# Patient Record
Sex: Female | Born: 1980 | Race: Black or African American | Hispanic: No | Marital: Single | Smoking: Former smoker
Health system: Southern US, Community
[De-identification: ages and names within clinical notes are randomized; demographics above are authoritative.]

## PROBLEM LIST (undated history)

## (undated) DIAGNOSIS — B977 Papillomavirus as the cause of diseases classified elsewhere: Secondary | ICD-10-CM

## (undated) DIAGNOSIS — R87629 Unspecified abnormal cytological findings in specimens from vagina: Secondary | ICD-10-CM

## (undated) HISTORY — PX: LEEP: SHX91

## (undated) HISTORY — DX: Papillomavirus as the cause of diseases classified elsewhere: B97.7

## (undated) HISTORY — DX: Unspecified abnormal cytological findings in specimens from vagina: R87.629

## (undated) HISTORY — PX: GANGLION CYST EXCISION: SHX1691

---

## 2010-10-28 ENCOUNTER — Emergency Department (HOSPITAL_BASED_OUTPATIENT_CLINIC_OR_DEPARTMENT_OTHER)
Admission: EM | Admit: 2010-10-28 | Discharge: 2010-10-28 | Disposition: A | Discharge status: Home / Self Care | Payer: Medicaid Other | Attending: Emergency Medicine | Admitting: Emergency Medicine

## 2010-10-28 ENCOUNTER — Emergency Department (INDEPENDENT_AMBULATORY_CARE_PROVIDER_SITE_OTHER): Payer: Medicaid Other

## 2010-10-28 DIAGNOSIS — R071 Chest pain on breathing: Secondary | ICD-10-CM | POA: Insufficient documentation

## 2010-10-28 DIAGNOSIS — R079 Chest pain, unspecified: Secondary | ICD-10-CM | POA: Insufficient documentation

## 2010-10-28 DIAGNOSIS — R0789 Other chest pain: Secondary | ICD-10-CM

## 2010-10-28 DIAGNOSIS — F17200 Nicotine dependence, unspecified, uncomplicated: Secondary | ICD-10-CM | POA: Insufficient documentation

## 2010-10-28 LAB — BASIC METABOLIC PANEL
BUN: 10 mg/dL (ref 6–23)
Chloride: 104 mEq/L (ref 96–112)
Glucose, Bld: 95 mg/dL (ref 70–99)
Potassium: 4.2 mEq/L (ref 3.5–5.1)

## 2011-04-04 ENCOUNTER — Emergency Department (HOSPITAL_BASED_OUTPATIENT_CLINIC_OR_DEPARTMENT_OTHER)
Admission: EM | Admit: 2011-04-04 | Discharge: 2011-04-04 | Disposition: A | Discharge status: Home / Self Care | Payer: Medicaid Other | Attending: Emergency Medicine | Admitting: Emergency Medicine

## 2011-04-04 ENCOUNTER — Encounter: Payer: Self-pay | Admitting: *Deleted

## 2011-04-04 DIAGNOSIS — R202 Paresthesia of skin: Secondary | ICD-10-CM

## 2011-04-04 DIAGNOSIS — R209 Unspecified disturbances of skin sensation: Secondary | ICD-10-CM | POA: Insufficient documentation

## 2011-04-04 NOTE — ED Notes (Signed)
Patient c/o numbness in L thumb since last night, no pain, no injury

## 2011-04-04 NOTE — ED Provider Notes (Signed)
History     CSN: [id number] Arrival date & time: 04/04/2011  4:01 PM  Chief Complaint  Patient presents with  . Numbness   HPI Comments: Pt denies any injury or weakness to the area:pt states that it is the whole forearm:pt states that she feel my cold hand touching her it just has a weird sensation  Patient is a 30 y.o. female presenting with neurologic complaint. The history is provided by the patient. No language interpreter was used.  Neurologic Problem The primary symptoms include paresthesias. Primary symptoms do not include focal weakness. The symptoms began yesterday. The symptoms are unchanged. The neurological symptoms are focal.  Additional symptoms do not include neck stiffness, weakness, pain, lower back pain, photophobia or nystagmus. Medical issues do not include hypertension or recent surgery.    Past Medical History  Diagnosis Date  . Migraine     Past Surgical History  Procedure Date  . Leep   . Ganglion cyst excision     History reviewed. No pertinent family history.  History  Substance Use Topics  . Smoking status: Former Games developer  . Smokeless tobacco: Not on file  . Alcohol Use: No    OB History    Grav Para Term Preterm Abortions TAB SAB Ect Mult Living                  Review of Systems  HENT: Negative for neck stiffness.   Eyes: Negative for photophobia.  Neurological: Positive for paresthesias. Negative for focal weakness and weakness.  All other systems reviewed and are negative.    Physical Exam  BP 132/82  Pulse 67  Temp(Src) 98.3 F (36.8 C) (Oral)  Resp 18  SpO2 100%  Physical Exam  Nursing note and vitals reviewed. Constitutional: She is oriented to person, place, and time. She appears well-developed and well-nourished.  HENT:  Head: Normocephalic.  Eyes: Pupils are equal, round, and reactive to light.  Neck: Normal range of motion. Neck supple.  Cardiovascular: Normal rate and regular rhythm.   Pulmonary/Chest: Effort  normal and breath sounds normal.  Musculoskeletal: Normal range of motion. She exhibits no edema and no tenderness.  Neurological: She is alert and oriented to person, place, and time. She has normal strength. No sensory deficit. Coordination normal.       Pt has cold sensation  Skin: Skin is warm and dry.  Psychiatric: She has a normal mood and affect.    ED Course  Procedures  MDM Will have nursing staff put splint incase related to carpal tunnel:not concerning for a cervical radiculopathy:pt has not sign of infection:will refer to hand and neurology      Teressa Lower, NP 04/04/11 [phone]

## 2011-04-05 NOTE — ED Provider Notes (Signed)
Medical screening examination/treatment/procedure(s) were performed by non-physician practitioner and as supervising physician I was immediately available for consultation/collaboration.   Cheo Selvey R. Britnay Magnussen, MD 04/05/11 0011 

## 2011-09-22 ENCOUNTER — Other Ambulatory Visit: Payer: Self-pay

## 2011-09-22 ENCOUNTER — Encounter (HOSPITAL_BASED_OUTPATIENT_CLINIC_OR_DEPARTMENT_OTHER): Payer: Self-pay | Admitting: *Deleted

## 2011-09-22 ENCOUNTER — Emergency Department (HOSPITAL_BASED_OUTPATIENT_CLINIC_OR_DEPARTMENT_OTHER)
Admission: EM | Admit: 2011-09-22 | Discharge: 2011-09-22 | Disposition: A | Discharge status: Home / Self Care | Payer: Self-pay | Attending: Emergency Medicine | Admitting: Emergency Medicine

## 2011-09-22 DIAGNOSIS — F17200 Nicotine dependence, unspecified, uncomplicated: Secondary | ICD-10-CM | POA: Insufficient documentation

## 2011-09-22 DIAGNOSIS — R002 Palpitations: Secondary | ICD-10-CM | POA: Insufficient documentation

## 2011-09-22 LAB — BASIC METABOLIC PANEL
CO2: 28 mEq/L (ref 19–32)
Calcium: 9.1 mg/dL (ref 8.4–10.5)
Creatinine, Ser: 0.8 mg/dL (ref 0.50–1.10)
Glucose, Bld: 81 mg/dL (ref 70–99)

## 2011-09-22 NOTE — ED Provider Notes (Signed)
History     CSN: [id number]  Arrival date & time 09/22/11  1339   First MD Initiated Contact with Patient 09/22/11 1346      Chief Complaint  Patient presents with  . Palpitations    HPI Patient complains of rapid heartbeat with palpitations prior to arrival associated with some dizziness.  She describes the sensation as a very rapid heartbeat.  She's never had this occur before.  She denies any specific pain or discomfort with it.  She did experience some dizziness.  Patient admits to drinking large amounts of tea every day, no other caffeine intake noted. Past Medical History  Diagnosis Date  . Migraine     Past Surgical History  Procedure Date  . Leep   . Ganglion cyst excision     History reviewed. No pertinent family history.  History  Substance Use Topics  . Smoking status: Current Everyday Smoker -- 1 years    Types: Cigars  . Smokeless tobacco: Not on file  . Alcohol Use: No    OB History    Grav Para Term Preterm Abortions TAB SAB Ect Mult Living                  Review of Systems Remaining review of systems negative except as noted in history of present illness Allergies  Erythromycin  Home Medications   Current Outpatient Rx  Name Route Sig Dispense Refill  . CULTURELLE DIGESTIVE HEALTH PO CAPS Oral Take 2 capsules by mouth as needed. yeast     . NADOLOL 40 MG PO TABS Oral Take 40 mg by mouth daily.      . SUMATRIPTAN SUCCINATE 100 MG PO TABS Oral Take 100 mg by mouth every 2 (two) hours as needed. Migraine       BP 128/89  Pulse 81  Temp(Src) 97.9 F (36.6 C) (Oral)  Resp 20  SpO2 100%  Physical Exam  Nursing note and vitals reviewed. Constitutional: She is oriented to person, place, and time. She appears well-developed and well-nourished. No distress.  HENT:  Head: Normocephalic and atraumatic.  Eyes: Pupils are equal, round, and reactive to light.  Neck: Normal range of motion.  Cardiovascular: Normal rate, regular rhythm, normal  heart sounds and intact distal pulses.          Date: 09/22/2011  Rate: 78  Rhythm: sinus arrhythmia  QRS Axis: normal  Intervals: normal  ST/T Wave abnormalities: normal  Conduction Disutrbances:none:   Old EKG Reviewed: unchanged     Pulmonary/Chest: No respiratory distress.  Abdominal: Normal appearance. She exhibits no distension.  Musculoskeletal: Normal range of motion.  Neurological: She is alert and oriented to person, place, and time. No cranial nerve deficit.  Skin: Skin is warm and dry. No rash noted.  Psychiatric: She has a normal mood and affect. Her behavior is normal.    ED Course  Procedures (including critical care time)   Labs Reviewed  BASIC METABOLIC PANEL  TROPONIN I   No results found.   1. Rapid palpitations       MDM          Nelia Shi, MD 09/22/11 1445

## 2011-09-22 NOTE — ED Notes (Signed)
Pt c/o palpitations with dizziness x 10 mins.

## 2011-09-22 NOTE — ED Notes (Signed)
MD at bedside. 

## 2016-08-30 NOTE — L&D Delivery Note (Signed)
Patient is a 36 y.o. now [id number] s/p NSVD at [redacted]w[redacted]d, who was admitted for IOL for GHTN. S/p IOL with misoprostol, foley, Oxytocin and AROM (at 13:03 today). GBS status: negative  Delivery Note At 5:40 PM a viable female was delivered via  (Presentation: vertex; LOA ).   Head delivered LOA. Nuchal cord x 1 present, loose and body delivered through. Shoulder and body delivered in usual fashion. Infant with good tone, placed on mother's abdomen, dried and bulb suctioned. Cord clamped x 2 after 1-minute delay, and cut by family member. Cord blood drawn. Placenta delivered spontaneously with gentle cord traction. Fundus firm with massage and Pitocin, but LUS atony noted with some continued bleeding. Misoprostol 1,000 mcg given rectally. Bleeding stopped with bimanual massage. Perineum inspected and found to have no lacerations.  APGAR: , ; weight pending. Placenta status: intact; to L&D.  Cord:  3-vessel  Anesthesia: Epidural Episiotomy:  none Lacerations:  none Est. Blood Loss (mL):  250  Mom to postpartum.  Baby to Couplet care / Skin to Skin.  Almyra Free P. Nasiya Pascual, MD OB Fellow 07/21/17, 6:23 PM   ---------------------------------------------------- Message sent to [facility]  [facility] admin pool: Please schedule this patient for PP visit in: 1 week BP check;  4-6 weeks for regular PPV High risk pregnancy complicated by: GHTN Delivery mode:  SVD Anticipated Birth Control:  other/unsure PP Procedures needed: BP check  Schedule Integrated [place] visit: no Provider: Any provider

## 2017-01-17 ENCOUNTER — Other Ambulatory Visit (HOSPITAL_COMMUNITY)
Admission: RE | Admit: 2017-01-17 | Discharge: 2017-01-17 | Disposition: A | Discharge status: Home / Self Care | Payer: Medicaid Other | Source: Ambulatory Visit | Attending: Obstetrics & Gynecology | Admitting: Obstetrics & Gynecology

## 2017-01-17 ENCOUNTER — Encounter: Payer: Self-pay | Admitting: Obstetrics & Gynecology

## 2017-01-17 ENCOUNTER — Ambulatory Visit (INDEPENDENT_AMBULATORY_CARE_PROVIDER_SITE_OTHER): Payer: Medicaid Other | Admitting: Obstetrics & Gynecology

## 2017-01-17 VITALS — BP 120/79 | HR 94 | Ht 64.0 in | Wt 158.0 lb

## 2017-01-17 DIAGNOSIS — Z3481 Encounter for supervision of other normal pregnancy, first trimester: Secondary | ICD-10-CM | POA: Insufficient documentation

## 2017-01-17 DIAGNOSIS — Z9889 Other specified postprocedural states: Secondary | ICD-10-CM

## 2017-01-17 DIAGNOSIS — O219 Vomiting of pregnancy, unspecified: Secondary | ICD-10-CM

## 2017-01-17 DIAGNOSIS — O99331 Smoking (tobacco) complicating pregnancy, first trimester: Secondary | ICD-10-CM

## 2017-01-17 DIAGNOSIS — R87619 Unspecified abnormal cytological findings in specimens from cervix uteri: Secondary | ICD-10-CM

## 2017-01-17 DIAGNOSIS — O99330 Smoking (tobacco) complicating pregnancy, unspecified trimester: Secondary | ICD-10-CM | POA: Insufficient documentation

## 2017-01-17 DIAGNOSIS — O09529 Supervision of elderly multigravida, unspecified trimester: Secondary | ICD-10-CM | POA: Insufficient documentation

## 2017-01-17 DIAGNOSIS — O3440 Maternal care for other abnormalities of cervix, unspecified trimester: Secondary | ICD-10-CM | POA: Insufficient documentation

## 2017-01-17 DIAGNOSIS — O09521 Supervision of elderly multigravida, first trimester: Secondary | ICD-10-CM

## 2017-01-17 DIAGNOSIS — Z3A10 10 weeks gestation of pregnancy: Secondary | ICD-10-CM | POA: Diagnosis not present

## 2017-01-17 DIAGNOSIS — Z3480 Encounter for supervision of other normal pregnancy, unspecified trimester: Secondary | ICD-10-CM

## 2017-01-17 MED ORDER — ONDANSETRON 4 MG PO TBDP: 4.0000 mg | ORAL_TABLET | Freq: Four times a day (QID) | ORAL | 0 refills | Status: DC | PRN

## 2017-01-17 NOTE — Progress Notes (Signed)
CLINICAL DATA:  Pregnant patient in 1st  trimester pregnancy with complaints of nausea and vomiting  EXAM: Transabdominal OB ULTRASOUND   TECHNIQUE:  abdominal ultrasound was performed for complete evaluation of the gestation as well as the maternal uterus, adnexal regions, and pelvic cul-de-sac.   FINDINGS: Intrauterine gestational GXQ:JJHERDE  Embryo:  present Cardiac Activity: present  Heart Rate: 166 bpm.  CRL: 4.0 cm [place] EDC:08-09-17 Subchorionic hemorrhage:  not noted  Kathrene Alu RN BSN

## 2017-01-17 NOTE — Patient Instructions (Signed)
First Trimester of Pregnancy The first trimester of pregnancy is from week 1 until the end of week 13 (months 1 through 3). A week after a sperm fertilizes an egg, the egg will implant on the wall of the uterus. This embryo will begin to develop into a baby. Genes from you and your partner will form the baby. The female genes will determine whether the baby will be a boy or a girl. At 6-8 weeks, the eyes and face will be formed, and the heartbeat can be seen on ultrasound. At the end of 12 weeks, all the baby's organs will be formed. Now that you are pregnant, you will want to do everything you can to have a healthy baby. Two of the most important things are to get good prenatal care and to follow your health care provider's instructions. Prenatal care is all the medical care you receive before the baby's birth. This care will help prevent, find, and treat any problems during the pregnancy and childbirth. Body changes during your first trimester Your body goes through many changes during pregnancy. The changes vary from woman to woman.  You may gain or lose a couple of pounds at first.  You may feel sick to your stomach (nauseous) and you may throw up (vomit). If the vomiting is uncontrollable, call your health care provider.  You may tire easily.  You may develop headaches that can be relieved by medicines. All medicines should be approved by your health care provider.  You may urinate more often. Painful urination may mean you have a bladder infection.  You may develop heartburn as a result of your pregnancy.  You may develop constipation because certain hormones are causing the muscles that push stool through your intestines to slow down.  You may develop hemorrhoids or swollen veins (varicose veins).  Your breasts may begin to grow larger and become tender. Your nipples may stick out more, and the tissue that surrounds them (areola) may become darker.  Your gums may bleed and may be  sensitive to brushing and flossing.  Dark spots or blotches (chloasma, mask of pregnancy) may develop on your face. This will likely fade after the baby is born.  Your menstrual periods will stop.  You may have a loss of appetite.  You may develop cravings for certain kinds of food.  You may have changes in your emotions from day to day, such as being excited to be pregnant or being concerned that something may go wrong with the pregnancy and baby.  You may have more vivid and strange dreams.  You may have changes in your hair. These can include thickening of your hair, rapid growth, and changes in texture. Some women also have hair loss during or after pregnancy, or hair that feels dry or thin. Your hair will most likely return to normal after your baby is born.  What to expect at prenatal visits During a routine prenatal visit:  You will be weighed to make sure you and the baby are growing normally.  Your blood pressure will be taken.  Your abdomen will be measured to track your baby's growth.  The fetal heartbeat will be listened to between weeks 10 and 14 of your pregnancy.  Test results from any previous visits will be discussed.  Your health care provider may ask you:  How you are feeling.  If you are feeling the baby move.  If you have had any abnormal symptoms, such as leaking fluid, bleeding, severe headaches,   or abdominal cramping.  If you are using any tobacco products, including cigarettes, chewing tobacco, and electronic cigarettes.  If you have any questions.  Other tests that may be performed during your first trimester include:  Blood tests to find your blood type and to check for the presence of any previous infections. The tests will also be used to check for low iron levels (anemia) and protein on red blood cells (Rh antibodies). Depending on your risk factors, or if you previously had diabetes during pregnancy, you may have tests to check for high blood  sugar that affects pregnant women (gestational diabetes).  Urine tests to check for infections, diabetes, or protein in the urine.  An ultrasound to confirm the proper growth and development of the baby.  Fetal screens for spinal cord problems (spina bifida) and Down syndrome.  HIV (human immunodeficiency virus) testing. Routine prenatal testing includes screening for HIV, unless you choose not to have this test.  You may need other tests to make sure you and the baby are doing well.  Follow these instructions at home: Medicines  Follow your health care provider's instructions regarding medicine use. Specific medicines may be either safe or unsafe to take during pregnancy.  Take a prenatal vitamin that contains at least 600 micrograms (mcg) of folic acid.  If you develop constipation, try taking a stool softener if your health care provider approves. Eating and drinking  Eat a balanced diet that includes fresh fruits and vegetables, whole grains, good sources of protein such as meat, eggs, or tofu, and low-fat dairy. Your health care provider will help you determine the amount of weight gain that is right for you.  Avoid raw meat and uncooked cheese. These carry germs that can cause birth defects in the baby.  Eating four or five small meals rather than three large meals a day may help relieve nausea and vomiting. If you start to feel nauseous, eating a few soda crackers can be helpful. Drinking liquids between meals, instead of during meals, also seems to help ease nausea and vomiting.  Limit foods that are high in fat and processed sugars, such as fried and sweet foods.  To prevent constipation: ? Eat foods that are high in fiber, such as fresh fruits and vegetables, whole grains, and beans. ? Drink enough fluid to keep your urine clear or pale yellow. Activity  Exercise only as directed by your health care provider. Most women can continue their usual exercise routine during  pregnancy. Try to exercise for 30 minutes at least 5 days a week. Exercising will help you: ? Control your weight. ? Stay in shape. ? Be prepared for labor and delivery.  Experiencing pain or cramping in the lower abdomen or lower back is a good sign that you should stop exercising. Check with your health care provider before continuing with normal exercises.  Try to avoid standing for long periods of time. Move your legs often if you must stand in one place for a long time.  Avoid heavy lifting.  Wear low-heeled shoes and practice good posture.  You may continue to have sex unless your health care provider tells you not to. Relieving pain and discomfort  Wear a good support bra to relieve breast tenderness.  Take warm sitz baths to soothe any pain or discomfort caused by hemorrhoids. Use hemorrhoid cream if your health care provider approves.  Rest with your legs elevated if you have leg cramps or low back pain.  If you develop   varicose veins in your legs, wear support hose. Elevate your feet for 15 minutes, 3-4 times a day. Limit salt in your diet. Prenatal care  Schedule your prenatal visits by the twelfth week of pregnancy. They are usually scheduled monthly at first, then more often in the last 2 months before delivery.  Write down your questions. Take them to your prenatal visits.  Keep all your prenatal visits as told by your health care provider. This is important. Safety  Wear your seat belt at all times when driving.  Make a list of emergency phone numbers, including numbers for family, friends, the hospital, and police and fire departments. General instructions  Ask your health care provider for a referral to a local prenatal education class. Begin classes no later than the beginning of month 6 of your pregnancy.  Ask for help if you have counseling or nutritional needs during pregnancy. Your health care provider can offer advice or refer you to specialists for help  with various needs.  Do not use hot tubs, steam rooms, or saunas.  Do not douche or use tampons or scented sanitary pads.  Do not cross your legs for long periods of time.  Avoid cat litter boxes and soil used by cats. These carry germs that can cause birth defects in the baby and possibly loss of the fetus by miscarriage or stillbirth.  Avoid all smoking, herbs, alcohol, and medicines not prescribed by your health care provider. Chemicals in these products affect the formation and growth of the baby.  Do not use any products that contain nicotine or tobacco, such as cigarettes and e-cigarettes. If you need help quitting, ask your health care provider. You may receive counseling support and other resources to help you quit.  Schedule a dentist appointment. At home, brush your teeth with a soft toothbrush and be gentle when you floss. Contact a health care provider if:  You have dizziness.  You have mild pelvic cramps, pelvic pressure, or nagging pain in the abdominal area.  You have persistent nausea, vomiting, or diarrhea.  You have a bad smelling vaginal discharge.  You have pain when you urinate.  You notice increased swelling in your face, hands, legs, or ankles.  You are exposed to fifth disease or chickenpox.  You are exposed to German measles (rubella) and have never had it. Get help right away if:  You have a fever.  You are leaking fluid from your vagina.  You have spotting or bleeding from your vagina.  You have severe abdominal cramping or pain.  You have rapid weight gain or loss.  You vomit blood or material that looks like coffee grounds.  You develop a severe headache.  You have shortness of breath.  You have any kind of trauma, such as from a fall or a car accident. Summary  The first trimester of pregnancy is from week 1 until the end of week 13 (months 1 through 3).  Your body goes through many changes during pregnancy. The changes vary from  woman to woman.  You will have routine prenatal visits. During those visits, your health care provider will examine you, discuss any test results you may have, and talk with you about how you are feeling. This information is not intended to replace advice given to you by your health care provider. Make sure you discuss any questions you have with your health care provider. Document Released: 08/10/2001 Document Revised: 07/28/2016 Document Reviewed: 07/28/2016 Elsevier Interactive Patient Education  2017 Elsevier   Inc.  

## 2017-01-17 NOTE — Addendum Note (Signed)
Addended by: Phill Myron on: 01/17/2017 11:30 AM   Modules accepted: Orders

## 2017-01-17 NOTE — Progress Notes (Signed)
  Subjective:    Courtney Farley is being seen today for her first obstetrical visit. [id number]. Last pregnancy 2006 LMP 11/01/2016 (second week of Mar). This is not a planned pregnancy. Pt is 'dealing' with it. She is at [redacted]w[redacted]d gestation. Her obstetrical history is significant for tob use early. Significant N/V/ Relationship with FOB: n/a. Patient does intend to breast feed. Pregnancy history fully reviewed. Pt c/o N/V. She was seen by Dr. Nyoka Farley and prescribed meds for N/V but, she could not get the Rx due to insurance issues. Tjh esx are slightly better it is no longer constant but the sx are still bad in the am and the evening.   H/o cigar smoking since age 72 years. Pt has stopped sine pregnancy diagnosed.   Patient reports nausea and vomiting.  Review of Systems:   Review of Systems: N/V  Objective:     BP 120/79   Pulse 94   Ht 5\' 4"  (1.626 m)   Wt 158 lb (71.7 kg)   LMP 11/02/2016   BMI 27.12 kg/m  Physical Exam  Exam  General Appearance:    Alert, cooperative, no distress, appears stated age  Head:    Normocephalic, without obvious abnormality, atraumatic  Eyes:    conjunctiva/corneas clear, EOM's intact, both eyes  Ears:    Normal external ear canals, both ears  Nose:   Nares normal, septum midline, mucosa normal, no drainage    or sinus tenderness  Throat:   Lips, mucosa, and tongue normal; teeth and gums normal  Neck:   Supple, symmetrical, trachea midline, no adenopathy;    thyroid:  no enlargement/tenderness/nodules  Back:     Symmetric, no curvature, ROM normal, no CVA tenderness  Lungs:     Clear to auscultation bilaterally, respirations unlabored  Chest Wall:    No tenderness or deformity   Heart:    Regular rate and rhythm, S1 and S2 normal, no murmur, rub   or gallop  Breast Exam:    No tenderness, masses, or nipple abnormality  Abdomen:     Soft, non-tender, bowel sounds active all four quadrants,    no masses, no organomegaly; pt has umbilical ring  Genitalia:     Normal female without lesion, discharge or tenderness; cervix: 3L and closed.  Pt has a clitoral piercing      Extremities:   Extremities normal, atraumatic, no cyanosis or edema  Pulses:   2+ and symmetric all extremities  Skin:   Skin color, texture, turgor normal, no rashes or lesions       Assessment:    Pregnancy: [id number] Patient Active Problem List   Diagnosis Date Noted  . Supervision of other normal pregnancy, antepartum 01/17/2017  . Abnormal cervical Papanicolaou smear affecting pregnancy, antepartum 01/17/2017  . Advanced maternal age in multigravida 01/17/2017  s/p LEEP x 2- both LEEPs were after her last delivery.  N/V in pegnancy     Plan:     Initial labs drawn. Prenatal vitamins. Problem list reviewed and updated. AFP3 discussed: requested. Pt desires 1st trimester screen Role of ultrasound in pregnancy discussed; fetal survey: requested. Amniocentesis discussed: not indicated. Follow up in 4 weeks. 65% of 45 min visit spent on counseling and coordination of care.  serial cervical lengths Due to h/o LEEP Zofran 4mg  q 4 hours  for N/V Colposcopy   Courtney Farley 01/17/2017

## 2017-01-18 LAB — OBSTETRIC PANEL, INCLUDING HIV
ANTIBODY SCREEN: NEGATIVE
BASOS: 0 %
Basophils Absolute: 0 10*3/uL (ref 0.0–0.2)
EOS (ABSOLUTE): 0.1 10*3/uL (ref 0.0–0.4)
Eos: 1 %
HEMATOCRIT: 37.5 % (ref 34.0–46.6)
HEMOGLOBIN: 12.4 g/dL (ref 11.1–15.9)
HEP B S AG: NEGATIVE
HIV SCREEN 4TH GENERATION: NONREACTIVE
IMMATURE GRANS (ABS): 0 10*3/uL (ref 0.0–0.1)
Immature Granulocytes: 0 %
Lymphocytes Absolute: 2.2 10*3/uL (ref 0.7–3.1)
Lymphs: 29 %
MCH: 28.4 pg (ref 26.6–33.0)
MCHC: 33.1 g/dL (ref 31.5–35.7)
MCV: 86 fL (ref 79–97)
MONOCYTES: 9 %
Monocytes Absolute: 0.7 10*3/uL (ref 0.1–0.9)
NEUTROS ABS: 4.8 10*3/uL (ref 1.4–7.0)
Neutrophils: 61 %
Platelets: 338 10*3/uL (ref 150–379)
RBC: 4.37 x10E6/uL (ref 3.77–5.28)
RDW: 13.8 % (ref 12.3–15.4)
RPR: NONREACTIVE
RUBELLA: 9.21 {index} (ref >0.99)
Rh Factor: POSITIVE
WBC: 7.7 10*3/uL (ref 3.4–10.8)

## 2017-01-18 LAB — GC/CHLAMYDIA PROBE AMP (~~LOC~~) NOT AT ARMC
Chlamydia: NEGATIVE
NEISSERIA GONORRHEA: NEGATIVE

## 2017-02-01 ENCOUNTER — Encounter (HOSPITAL_COMMUNITY): Payer: Self-pay | Admitting: *Deleted

## 2017-02-03 ENCOUNTER — Ambulatory Visit (HOSPITAL_COMMUNITY)
Admission: RE | Admit: 2017-02-03 | Discharge: 2017-02-03 | Disposition: A | Discharge status: Home / Self Care | Payer: Medicaid Other | Source: Ambulatory Visit | Attending: Obstetrics & Gynecology | Admitting: Obstetrics & Gynecology

## 2017-02-03 ENCOUNTER — Encounter (HOSPITAL_COMMUNITY): Payer: Self-pay

## 2017-02-03 ENCOUNTER — Other Ambulatory Visit: Payer: Self-pay | Admitting: Obstetrics & Gynecology

## 2017-02-03 DIAGNOSIS — Z3A13 13 weeks gestation of pregnancy: Secondary | ICD-10-CM | POA: Insufficient documentation

## 2017-02-03 DIAGNOSIS — N8311 Corpus luteum cyst of right ovary: Secondary | ICD-10-CM | POA: Insufficient documentation

## 2017-02-03 DIAGNOSIS — O3412 Maternal care for benign tumor of corpus uteri, second trimester: Secondary | ICD-10-CM | POA: Diagnosis not present

## 2017-02-03 DIAGNOSIS — Z3480 Encounter for supervision of other normal pregnancy, unspecified trimester: Secondary | ICD-10-CM

## 2017-02-03 DIAGNOSIS — O283 Abnormal ultrasonic finding on antenatal screening of mother: Secondary | ICD-10-CM

## 2017-02-03 DIAGNOSIS — Z3682 Encounter for antenatal screening for nuchal translucency: Secondary | ICD-10-CM | POA: Insufficient documentation

## 2017-02-03 DIAGNOSIS — D259 Leiomyoma of uterus, unspecified: Secondary | ICD-10-CM | POA: Diagnosis not present

## 2017-02-04 ENCOUNTER — Encounter (HOSPITAL_COMMUNITY): Payer: Self-pay

## 2017-02-04 DIAGNOSIS — O283 Abnormal ultrasonic finding on antenatal screening of mother: Secondary | ICD-10-CM | POA: Insufficient documentation

## 2017-02-04 DIAGNOSIS — Z3A13 13 weeks gestation of pregnancy: Secondary | ICD-10-CM | POA: Insufficient documentation

## 2017-02-04 NOTE — Progress Notes (Signed)
Genetic Counseling  High-Risk Gestation Note  Appointment Date:  02/03/17 Referred By: Willodean Rosenthal* Date of Birth:  21-Jul-1981 Farley: Courtney Farley   Pregnancy History: [id number] Estimated Date of Delivery: 08/09/17 Estimated Gestational Age: [redacted]w[redacted]d Attending: Eulis Foster, MD  I met with Courtney Farley and Courtney Farley, Courtney Farley, for genetic counseling because of increased nuchal translucency measurement on today's ultrasound.   In summary:  Discussed ultrasound findings in detail-  Increased nuchal translucency  Discussed associations including normal variation, chromosome condition, structural anomaly, single gene condition  Reviewed options for additional screening  NIPS- elected to pursue Panorama today  Echocardiogram  Ongoing ultrasound  Reviewed options for diagnostic testing, including risks, benefits, limitations and alternatives  Declined amniocentesis  Reviewed other explanations for ultrasound findings  Reviewed family history concerns  Two nieces with Congenital Adrenal Hyperplasia  Reviewed autosomal recessive inheritance an approximate 50% chance for patient to be carrier given report  Father of pregnancy would be estimated to have general population carrier frequency  Patient declined CAH carrier screening at this time  The patient had a nuchal translucency (NT) ultrasound today, which revealed an NT measurement of 3.1 mm, which is approximately the 97%tile for the current gestational age. Nasal bone was visualized today. We discussed that the fetal NT refers to a fluid filled space between the skin and soft tissues behind the cervical spine. This space is traditionally measurable between 11 and 13.[redacted] weeks gestation and is considered enlarged when the measurement is equal to or greater than the 95th percentile for the gestational age. This couple was counseled regarding the various common etiologies for an enlarged NT including: aneuploidy, single gene  conditions, cardiac or great vessel abnormalities, lymphatic system failure, decreased fetal movement, and fetal anemia.   We reviewed chromosomes, nondisjunction, and the common features and prognoses of Down syndrome and other aneuploidies. Considering Ms. Conrey's age, an NT of 3.1 mm and a CRL of 81 mm, the risk for fetal Down syndrome is estimated to be 1 in 170, with the overall risk for fetal aneuploidy estimated to be 1 in 85. They were then counseled regarding the availability of aneuploidy screening options including First screen, noninvasive prenatal screening (NIPS)/prenatal cell free DNA testing, and detailed ultrasound in the second trimester. They were counseled that screening tests are used to modify a patient's a priori risk for aneuploidy, typically based on age. This estimate provides a pregnancy specific risk assessment. We reviewed the benefits and limitations of each option. Specifically, we discussed the conditions for which each test screens, the detection rates, and false positive rates of each. They were also counseled regarding diagnostic testing via CVS and amniocentesis. We reviewed the associated risks for complications, including spontaneous pregnancy loss. We discussed the possible results that the tests might provide including: positive, negative, unanticipated, and no result. Finally, they were counseled regarding the cost of each option and potential out of pocket expenses. After consideration of all the options, they elected to proceed with NIPS (Panorama through [facility] laboratory). Those results will be available in approximately 7-10 days. She declined diagnostic, invasive testing (CVS, amniocentesis).   In addition, we discussed that an NT of 3.1 mm is associated with an approximate 1% risk for a fetal cardiac anomaly. We discussed the options of a fetal echocardiogram and detailed anatomy ultrasound as methods to evaluate the fetal heart. A fetal echocardiogram as well as  detailed ultrasound are available in the second trimester.   We also discussed single gene conditions. They were counseled  that an increased NT is associated with an increased chance for specific single gene conditions including Noonan spectrum disorders, skeletal dysplasias, SLOS, CdLS, and many others. We discussed that these conditions are not routinely tested for prenatally unless ultrasound findings or family history significantly increase the suspicion of a specific single gene disorder; however, there is new noninvasive prenatal screening (NIPS) technology which assesses for specific alterations in 30 genes, including the genes associated with Noonan syndrome, some skeletal dysplasias, and CdLS. The genes included on this NIPS platform typically follow autosomal dominant or X-linked inheritance, with the majority of pathogenic variants occurring de novo. We discussed that this testing, marketed as Senaida Ores, is available. We reviewed the benefits, limitations, and cost of this technology. Maternal and paternal samples are required for this screen. Limitations include that the testing cannot be performed if the patient herself has an underlying pathogenic variant in one of these genes. If the father of the pregnancy is identified to have a pathogenic variant, then a separate report would be issued. Detection rates vary by gene and are reported to range from  They understand that many of the features of these conditions can be detected by detailed ultrasound during the second trimester; however, ultrasound cannot definitively diagnose or rule out these conditions. NIPS for single gene conditions does not assess for autosomal recessive conditions and does not assess for all genetic conditions. We briefly reviewed common inheritance patterns (dominant, recessive, and X-linked) as well as the associated risks of recurrence. Ms. Clarida declined Senaida Ores today. Consider single gene NIPS platform, pending results of  NIPS for aneuploidy and/or pending results of detailed anatomy ultrasound.   Regarding autosomal recessive conditions, carrier screening is available to the patient and/or Courtney Farley for select conditions, some of which can be associated with increased NT, such as SLOS. We reviewed risks, benefits, and limitations of expanded carrier screening panels. We discussed that there is also a chance of identifying carrier status for a condition which is not related to the ultrasound finding. The prevalence of each condition varies (and often varies with ethnicity). Thus the couples' background risk to be a carrier for each of these various conditions would range, and in some cases be very low or unknown. Similarly, the detection rate varies with each condition and also varies in some cases with ethnicity, ranging from greater than 99% (in the case of hemoglobinopathies) to unknown. We reviewed that a negative carrier screen would thus reduce, but not eliminate the chance to be a carrier for these conditions. For some conditions included on specific pan-ethnic carrier screening panels, the pre-test carrier frequency and/or the detection rate is unknown. Thus, for some conditions, the exact reduction of risk with a negative carrier screening result may not be able to be quantified. We reviewed that in the event that one Farley is found to be a carrier for one or more conditions, carrier screening would be available to the Farley for those conditions. Ms. Perri declined expanded carrier screening today.   We also discussed that an increased NT value can be a normal variant, which can resolve during the pregnancy. Given the measured NT on ultrasound today, this is the most likely scenario. They were counseled that the fetal prognosis depends on the underlying etiology of the enlarged NT and further anticipatory guidance can be provided if a diagnosis is discovered.  They understand that ultrasound and screening tests  cannot diagnose or rule out all birth defects or genetic syndromes. The patient was advised of this  limitation.   Both family histories were reviewed and found to be contributory for Congenital Adrenal Hyperplasia (CAH) for two of Ms. Spratling's nieces (Courtney sister's daughters). The patient reported that Courtney nieces were each born with ambiguous genitalia and had surgical correction. They are treated with cortisol, are currently ages 63 years and 34 years old, and are reportedly doing well. The specific type of CAH was not known by Ms. Barbee. We discuss that the virilizing congential adrenal hyperplasias are a group of autosomal recessive disorders characterized by impaired synthesis of cortisol from cholesterol by the adrenal cortex.  21-hydroxylase deficiency (21-OHD) is the most common cause of CAH.  In 21-OHD CAH, excessive adrenal androgen biosynthesis results in virilization in all individuals and salt wasting in some individuals.  A classic form with severe enzyme deficiency and prenatal onset of virilization is distinguished from a non-classic form with mild enzyme deficiency and postnatal onset.  There is not sufficient information available to make a determination about the specific type of CAH in the family, though we discussed that 21-OHD CAH is the most common form. Mutations in the CYP21A2 gene located on chromosome 6, are responsible for 21-OHD.    We reviewed the autosomal recessive inheritance of CAH. Given the reported family history, the patient's sister would be assumed to be an obligate carrier, in which case Ms. Limones would have a 50% (1 in 2) chance to be a carrier for [facility].  There are reports of de novo gene mutations in CAH, however, such that one parent could have two normal CAH genes and still have a child with CAH due to a spontaneous mutation.  As the father of the baby is not related to Ms. Holz's family, he would have the general population chance to be a carrier for [facility] which is  approximately 1 in 42.  Thus, without further testing, there is an approximate 1 in 480 chance for CAH in a pregnancy for the couple together. We discussed that carrier screening for CAH could be performed for Ms. Gaut or Courtney Farley, but testing would be most informative after first confirming the type of CAH in the family to ensure the correct gene is tested. Additionally, carrier screening for Ms. Kuntzman would be most informative if the familial pathogenic variant has been identified. Carrier screening for CYP21A2 in the general population has an approximate 92% detection rate. Prenatal diagnosis would be available for an identified carrier couple via amniocentesis.  The patient stated that she is not interested in carrier screening for CAH at this time.   Additionally, Ms. Mccroskey reported four maternal first cousin (all full siblings to each other) with autism; these relatives are Courtney maternal aunt's two sons and two daughters. The patient reported that one girl and one boy are more severely affected than the other two. They were not described to have any dysmorphic features. No underlying etiology is known. We discussed that autism is part of the spectrum of conditions referred to as Autistic spectrum disorders (ASD). We discussed that ASDs are among the most common neurodevelopmental disorders, with approximately 1 in 68 children meeting criteria for ASD, according to the Centers for Disease Control. Approximately 80% of individuals diagnosed are female. There is strong evidence that genetic factors play a critical role in development of ASD. There have been recent advances in identifying specific genetic causes of ASD, however, there are still many individuals for whom the etiology of the ASD is not known. In the case of four siblings with  autism, an underlying genetic etiology or component would be strongly suspected. Once a family has a child with a diagnosis of ASD, there is a 13.5% chance to have another  child with ASD. If the pregnancy is female the chance is approximately 9%, and approximately 26% if the pregnancy is female. When there is more than one affected sibling, the recurrence chance is 32%. Limited data are available regarding recurrence risk for extended degree relatives. In the absence of an identified genetic etiology, prenatal screening or testing would not be available in the current pregnancy for the autism spectrum disorders in the family. Without further information regarding the provided family history, an accurate genetic risk cannot be calculated. Further genetic counseling is warranted if more information is obtained.  Ms. Gilberte Gorley denied exposure to environmental toxins or chemical agents. She denied the use of alcohol, tobacco or street drugs. She denied significant viral illnesses during the course of Courtney pregnancy. Courtney medical and surgical histories were noncontributory.   I counseled this couple regarding the above risks and available options.  The approximate face-to-face time with the genetic counselor was 40 minutes.  Chipper Oman, MS Certified Genetic Counselor 02/04/2017

## 2017-02-09 ENCOUNTER — Other Ambulatory Visit: Payer: Self-pay

## 2017-02-09 DIAGNOSIS — Z3480 Encounter for supervision of other normal pregnancy, unspecified trimester: Secondary | ICD-10-CM

## 2017-02-10 ENCOUNTER — Encounter: Payer: Medicaid Other | Admitting: Obstetrics & Gynecology

## 2017-02-10 ENCOUNTER — Telehealth (HOSPITAL_COMMUNITY): Payer: Self-pay | Admitting: MS"

## 2017-02-10 NOTE — Telephone Encounter (Signed)
Attempted to contact patient regarding results of noninvasive prenatal screening for aneuploidy (Panorama), which are within normal limits. Patient did not answer, and voice mailbox is not set up. Unable to leave message for patient to return call.   [name]  02/10/2017 9:03 AM

## 2017-02-11 ENCOUNTER — Telehealth (HOSPITAL_COMMUNITY): Payer: Self-pay | Admitting: MS"

## 2017-02-11 NOTE — Telephone Encounter (Signed)
Called Courtney Farley to discuss her prenatal cell free DNA test results.  Ms. Courtney Farley had Panorama testing through [facility].  Testing was offered because of increased nuchal translucency on ultrasound.   The patient was identified by name and DOB.  We reviewed that these are within normal limits, showing a less than 1 in 10,000 risk for trisomies 21, 18 and 13, and monosomy X (Turner syndrome).  In addition, the risk for triploidy and sex chromosome trisomies (47,XXX and 47,XXY) was also low risk.  Analysis for 22q11 deletion syndrome was also low risk (1 in 9,000).  We reviewed that this testing identifies > 99% of pregnancies with trisomy 7, trisomy 59, sex chromosome trisomies (47,XXX and 47,XXY), and triploidy. The detection rate for trisomy 18 is 98%.  The detection rate for monosomy X is ~92%.  The false positive rate is <0.1% for all conditions. Testing was also consistent with female fetal sex.  The patient did wish to know fetal sex.  She understands that this testing does not identify all genetic conditions.  All questions were answered to her satisfaction, she was encouraged to call with additional questions or concerns.  Chipper Oman, MS Certified Genetic Counselor 02/11/2017 12:43 PM

## 2017-02-11 NOTE — Telephone Encounter (Signed)
Attempted to contact patient regarding results of noninvasive prenatal screening (Panorama), which are within normal limits. Left message for patient to return call.   Santiago Glad Shahad Mazurek 02/11/2017 12:14 PM

## 2017-02-14 ENCOUNTER — Encounter: Payer: Medicaid Other | Admitting: Obstetrics & Gynecology

## 2017-02-16 ENCOUNTER — Ambulatory Visit (INDEPENDENT_AMBULATORY_CARE_PROVIDER_SITE_OTHER): Payer: Medicaid Other | Admitting: Obstetrics & Gynecology

## 2017-02-16 VITALS — BP 118/70 | HR 94 | Wt 161.0 lb

## 2017-02-16 DIAGNOSIS — Z331 Pregnant state, incidental: Secondary | ICD-10-CM

## 2017-02-16 DIAGNOSIS — R87619 Unspecified abnormal cytological findings in specimens from cervix uteri: Secondary | ICD-10-CM

## 2017-02-16 DIAGNOSIS — O283 Abnormal ultrasonic finding on antenatal screening of mother: Secondary | ICD-10-CM

## 2017-02-16 DIAGNOSIS — O99332 Smoking (tobacco) complicating pregnancy, second trimester: Secondary | ICD-10-CM

## 2017-02-16 DIAGNOSIS — O3442 Maternal care for other abnormalities of cervix, second trimester: Secondary | ICD-10-CM

## 2017-02-16 DIAGNOSIS — O3440 Maternal care for other abnormalities of cervix, unspecified trimester: Secondary | ICD-10-CM

## 2017-02-16 DIAGNOSIS — Z3480 Encounter for supervision of other normal pregnancy, unspecified trimester: Secondary | ICD-10-CM

## 2017-02-16 DIAGNOSIS — Z9889 Other specified postprocedural states: Secondary | ICD-10-CM

## 2017-02-16 DIAGNOSIS — Z3482 Encounter for supervision of other normal pregnancy, second trimester: Secondary | ICD-10-CM

## 2017-02-16 DIAGNOSIS — O99330 Smoking (tobacco) complicating pregnancy, unspecified trimester: Secondary | ICD-10-CM

## 2017-02-16 NOTE — Progress Notes (Signed)
Patient presents for colposcopy. Kathrene Alu RNBSN

## 2017-02-16 NOTE — Progress Notes (Signed)
   PRENATAL VISIT NOTE  Subjective:  Courtney Farley is a 36 y.o. G3P2002 at Unknown being seen today for ongoing prenatal care.  She is currently monitored for the following issues for this high-risk pregnancy and has Supervision of other normal pregnancy, antepartum; Abnormal cervical Papanicolaou smear affecting pregnancy, antepartum; History of cervical LEEP biopsy affecting care of mother, antepartum; Maternal tobacco use, antepartum; and Increased nuchal translucency space on fetal ultrasound on her problem list.  Patient reports no complaints.  Contractions: Not present. Vag. Bleeding: None.  Movement: Present. Denies leaking of fluid.   The following portions of the patient's history were reviewed and updated as appropriate: allergies, current medications, past family history, past medical history, past social history, past surgical history and problem list. Problem list updated.  Objective:   Vitals:   02/16/17 1443  BP: 118/70  Pulse: 94  Weight: 161 lb (73 kg)    Fetal Status:     Movement: Present     General:  Alert, oriented and cooperative. Patient is in no acute distress.  Skin: Skin is warm and dry. No rash noted.   Cardiovascular: Normal heart rate noted  Respiratory: Normal respiratory effort, no problems with respiration noted  Abdomen: Soft, gravid, appropriate for gestational age.       Pelvic:  Cervical exam performed        Extremities: Normal range of motion.  Edema: None  Mental Status: Normal mood and affect. Normal behavior. Normal judgment and thought content.   Assessment and Plan:  Pregnancy: G3P2002 at Unknown  1. Supervision of other normal pregnancy, antepartum  - [place] MFM OB COMP + 14 WK; Future  2. Maternal tobacco use, antepartum  3. Increased nuchal translucency space on fetal ultrasound AFP on next visit  [place] M[facility] OB COMP + 14 WK; Future  4. History of cervical LEEP biopsy affecting care of mother, antepartum Cervix appear shortened. Rec CL  prior to anatomy scan- in 1-2 days. Discussed with pt possible need for cerclage.   - [place] M[facility] OB Transvaginal; Future  5. Abnormal cervical Papanicolaou smear affecting pregnancy, antepartum S/p colposcopy today Patient given informed consent, signed copy in the chart, time out was performed.  Placed in lithotomy position. Cervix viewed with speculum and colposcope after application of acetic acid.   Colposcopy adequate?  yes Acetowhite lesions? no Punctation? no Mosaicism?  no Abnormal vasculature?  no Biopsies? no ECC? no   Patient was given post procedure instructions.  She will return in 2 weeks for results.  Preterm labor symptoms and general obstetric precautions including but not limited to vaginal bleeding, contractions, leaking of fluid and fetal movement were reviewed in detail with the patient. Please refer to After Visit Summary for other counseling recommendations.  Return in about 2 weeks (around 03/02/2017).   [name], MD

## 2017-02-16 NOTE — Addendum Note (Signed)
Addended by: [name] on: 02/16/2017 03:41 PM   Modules accepted: Orders

## 2017-02-16 NOTE — Patient Instructions (Signed)

## 2017-02-17 ENCOUNTER — Encounter (HOSPITAL_COMMUNITY): Payer: Self-pay

## 2017-02-17 ENCOUNTER — Ambulatory Visit (HOSPITAL_COMMUNITY)
Admission: RE | Admit: 2017-02-17 | Discharge: 2017-02-17 | Disposition: A | Discharge status: Home / Self Care | Payer: Medicaid Other | Source: Ambulatory Visit | Attending: Obstetrics & Gynecology | Admitting: Obstetrics & Gynecology

## 2017-02-17 DIAGNOSIS — R87619 Unspecified abnormal cytological findings in specimens from cervix uteri: Secondary | ICD-10-CM | POA: Insufficient documentation

## 2017-02-17 DIAGNOSIS — O3440 Maternal care for other abnormalities of cervix, unspecified trimester: Secondary | ICD-10-CM | POA: Insufficient documentation

## 2017-02-17 DIAGNOSIS — O321XX0 Maternal care for breech presentation, not applicable or unspecified: Secondary | ICD-10-CM | POA: Insufficient documentation

## 2017-02-17 DIAGNOSIS — O283 Abnormal ultrasonic finding on antenatal screening of mother: Secondary | ICD-10-CM

## 2017-02-17 DIAGNOSIS — Z3686 Encounter for antenatal screening for cervical length: Secondary | ICD-10-CM | POA: Insufficient documentation

## 2017-02-17 DIAGNOSIS — Z9889 Other specified postprocedural states: Secondary | ICD-10-CM | POA: Insufficient documentation

## 2017-02-17 DIAGNOSIS — Z3A15 15 weeks gestation of pregnancy: Secondary | ICD-10-CM | POA: Diagnosis not present

## 2017-02-17 NOTE — Addendum Note (Signed)
Encounter addended by: Jill Poling, RT on: 02/17/2017  4:25 PM<BR>    Actions taken: Imaging Exam ended

## 2017-02-18 NOTE — Addendum Note (Signed)
Encounter addended by: [name], MD on: 02/18/2017 12:27 AM<BR>    Actions taken: Problem List modified

## 2017-03-03 ENCOUNTER — Other Ambulatory Visit (HOSPITAL_COMMUNITY)
Admission: RE | Admit: 2017-03-03 | Discharge: 2017-03-03 | Disposition: A | Discharge status: Home / Self Care | Payer: Medicaid Other | Source: Ambulatory Visit | Attending: Obstetrics & Gynecology | Admitting: Obstetrics & Gynecology

## 2017-03-03 ENCOUNTER — Ambulatory Visit (INDEPENDENT_AMBULATORY_CARE_PROVIDER_SITE_OTHER): Payer: Medicaid Other | Admitting: Obstetrics & Gynecology

## 2017-03-03 VITALS — BP 124/85 | HR 92 | Wt 165.0 lb

## 2017-03-03 DIAGNOSIS — O3440 Maternal care for other abnormalities of cervix, unspecified trimester: Secondary | ICD-10-CM

## 2017-03-03 DIAGNOSIS — Z3482 Encounter for supervision of other normal pregnancy, second trimester: Secondary | ICD-10-CM

## 2017-03-03 DIAGNOSIS — Z3480 Encounter for supervision of other normal pregnancy, unspecified trimester: Secondary | ICD-10-CM

## 2017-03-03 DIAGNOSIS — B9689 Other specified bacterial agents as the cause of diseases classified elsewhere: Secondary | ICD-10-CM | POA: Insufficient documentation

## 2017-03-03 DIAGNOSIS — O283 Abnormal ultrasonic finding on antenatal screening of mother: Secondary | ICD-10-CM

## 2017-03-03 DIAGNOSIS — O99330 Smoking (tobacco) complicating pregnancy, unspecified trimester: Secondary | ICD-10-CM

## 2017-03-03 DIAGNOSIS — N898 Other specified noninflammatory disorders of vagina: Secondary | ICD-10-CM

## 2017-03-03 DIAGNOSIS — Z9889 Other specified postprocedural states: Secondary | ICD-10-CM

## 2017-03-03 DIAGNOSIS — N760 Acute vaginitis: Secondary | ICD-10-CM | POA: Insufficient documentation

## 2017-03-03 DIAGNOSIS — O99332 Smoking (tobacco) complicating pregnancy, second trimester: Secondary | ICD-10-CM

## 2017-03-03 DIAGNOSIS — R87619 Unspecified abnormal cytological findings in specimens from cervix uteri: Secondary | ICD-10-CM

## 2017-03-03 DIAGNOSIS — O3442 Maternal care for other abnormalities of cervix, second trimester: Secondary | ICD-10-CM

## 2017-03-03 NOTE — Progress Notes (Signed)
   PRENATAL VISIT NOTE  Subjective:  Courtney Farley is a 36 y.o. G3P2002 at 88w2dbeing seen today for ongoing prenatal care.  She is currently monitored for the following issues for this high-risk pregnancy and has Supervision of other normal pregnancy, antepartum; Abnormal cervical Papanicolaou smear affecting pregnancy, antepartum; History of cervical LEEP biopsy affecting care of mother, antepartum; Maternal tobacco use, antepartum; and Increased nuchal translucency space on fetal ultrasound on her problem list.  Patient reports she has a discharge with a musty odor. It has been present for several weeks.  Contractions: Not present. Vag. Bleeding: None.  Movement: Present. Denies leaking of fluid.   The following portions of the patient's history were reviewed and updated as appropriate: allergies, current medications, past family history, past medical history, past social history, past surgical history and problem list. Problem list updated.  Objective:   Vitals:   03/03/17 0911  BP: 124/85  Pulse: 92  Weight: 165 lb (74.8 kg)    Fetal Status:     Movement: Present     General:  Alert, oriented and cooperative. Patient is in no acute distress.  Skin: Skin is warm and dry. No rash noted.   Cardiovascular: Normal heart rate noted  Respiratory: Normal respiratory effort, no problems with respiration noted  Abdomen: Soft, gravid, appropriate for gestational age. Pain/Pressure: Present     Pelvic:  Cervical exam deferred        Extremities: Normal range of motion.  Edema: Trace  Mental Status: Normal mood and affect. Normal behavior. Normal judgment and thought content.   Assessment and Plan:  Pregnancy: G3P2002 at 122w2d1. Supervision of other normal pregnancy, antepartum AFP today  2. Maternal tobacco use, antepartum  3. Increased nuchal translucency space on fetal ultrasound  - patient met with the genetic counselor; see separate report - discussed association with  aneuploidy, fetal loss, congenital anomalies, and skeletal dysplasia - after counseling patient opted for cell free fetal DNA which was done today- LOW RISK - recommend fetal echocardiogram at 18-22 weeks- referral sent   Recommend detailed fetal anatomic survey at 18-20 weeks- scheduled  Recommend offer maternal serum AFP at 15-20 weeks- done today  4. History of cervical LEEP biopsy affecting care of mother, antepartum 02/17/2017 Cervical length [id number] no funnelling or changes with  transfundal pressure  Repeat with anatomy scan  5. Abnormal cervical Papanicolaou smear affecting pregnancy, antepartum S/p colpo. Rec repeat PP  6. Vaginal odor Wet mount sent  Preterm labor symptoms and general obstetric precautions including but not limited to vaginal bleeding, contractions, leaking of fluid and fetal movement were reviewed in detail with the patient. Please refer to After Visit Summary for other counseling recommendations.  Return in about 4 weeks (around 03/31/2017).  Total face-to-face time with patient was 20 min.  Greater than 50% was spent in counseling and coordination of care with the patient.  CarLavonia DraftsD

## 2017-03-03 NOTE — Progress Notes (Signed)
Pt states she has been experiencing odor and discharge. Would like to be checked for BV.

## 2017-03-03 NOTE — Patient Instructions (Signed)

## 2017-03-04 LAB — CERVICOVAGINAL ANCILLARY ONLY: Bacterial vaginitis: POSITIVE — AB

## 2017-03-07 ENCOUNTER — Other Ambulatory Visit: Payer: Self-pay | Admitting: Obstetrics & Gynecology

## 2017-03-07 DIAGNOSIS — N760 Acute vaginitis: Principal | ICD-10-CM

## 2017-03-07 DIAGNOSIS — B9689 Other specified bacterial agents as the cause of diseases classified elsewhere: Secondary | ICD-10-CM

## 2017-03-07 MED ORDER — METRONIDAZOLE 500 MG PO TABS: 500.0000 mg | ORAL_TABLET | Freq: Two times a day (BID) | ORAL | 0 refills | Status: DC

## 2017-03-09 LAB — AFP, SERUM, OPEN SPINA BIFIDA
AFP MOM: 1.21
AFP VALUE AFPOSL: 48.7 ng/mL
Gest. Age on Collection Date: 17.3 weeks
MATERNAL AGE AT EDD: 34.8 a
OSBR Risk 1 IN: 10000
Test Results:: NEGATIVE
WEIGHT: 165 [lb_av]

## 2017-03-15 ENCOUNTER — Ambulatory Visit (HOSPITAL_COMMUNITY)
Admission: RE | Admit: 2017-03-15 | Discharge: 2017-03-15 | Disposition: A | Discharge status: Home / Self Care | Payer: Medicaid Other | Source: Ambulatory Visit | Attending: Obstetrics & Gynecology | Admitting: Obstetrics & Gynecology

## 2017-03-15 ENCOUNTER — Other Ambulatory Visit: Payer: Self-pay | Admitting: Obstetrics & Gynecology

## 2017-03-15 ENCOUNTER — Other Ambulatory Visit (HOSPITAL_COMMUNITY): Payer: Self-pay | Admitting: *Deleted

## 2017-03-15 ENCOUNTER — Encounter (HOSPITAL_COMMUNITY): Payer: Self-pay

## 2017-03-15 DIAGNOSIS — O3412 Maternal care for benign tumor of corpus uteri, second trimester: Secondary | ICD-10-CM

## 2017-03-15 DIAGNOSIS — D259 Leiomyoma of uterus, unspecified: Secondary | ICD-10-CM | POA: Diagnosis not present

## 2017-03-15 DIAGNOSIS — Z3686 Encounter for antenatal screening for cervical length: Secondary | ICD-10-CM | POA: Insufficient documentation

## 2017-03-15 DIAGNOSIS — O3442 Maternal care for other abnormalities of cervix, second trimester: Secondary | ICD-10-CM | POA: Diagnosis not present

## 2017-03-15 DIAGNOSIS — Z3A19 19 weeks gestation of pregnancy: Secondary | ICD-10-CM | POA: Diagnosis not present

## 2017-03-15 DIAGNOSIS — Z3689 Encounter for other specified antenatal screening: Secondary | ICD-10-CM

## 2017-03-15 DIAGNOSIS — O358XX0 Maternal care for other (suspected) fetal abnormality and damage, not applicable or unspecified: Secondary | ICD-10-CM | POA: Insufficient documentation

## 2017-03-15 DIAGNOSIS — Z3480 Encounter for supervision of other normal pregnancy, unspecified trimester: Secondary | ICD-10-CM

## 2017-03-15 DIAGNOSIS — O3410 Maternal care for benign tumor of corpus uteri, unspecified trimester: Principal | ICD-10-CM

## 2017-03-15 DIAGNOSIS — O359XX0 Maternal care for (suspected) fetal abnormality and damage, unspecified, not applicable or unspecified: Secondary | ICD-10-CM

## 2017-03-16 ENCOUNTER — Telehealth: Payer: Self-pay

## 2017-03-16 NOTE — Telephone Encounter (Signed)
Patient presents in office for results. Patient states she call back once we called her last week with results. Patient given results that she has bacterial vaginosis and she needs to take all the full course of Flagyl. Patient states understanding. Kathrene Alu RNBSN

## 2017-03-20 ENCOUNTER — Encounter: Payer: Self-pay | Admitting: Obstetrics & Gynecology

## 2017-03-20 DIAGNOSIS — O3410 Maternal care for benign tumor of corpus uteri, unspecified trimester: Secondary | ICD-10-CM

## 2017-03-20 DIAGNOSIS — D259 Leiomyoma of uterus, unspecified: Secondary | ICD-10-CM | POA: Insufficient documentation

## 2017-03-31 ENCOUNTER — Ambulatory Visit (INDEPENDENT_AMBULATORY_CARE_PROVIDER_SITE_OTHER): Payer: Medicaid Other | Admitting: Family Medicine

## 2017-03-31 VITALS — BP 118/80 | HR 101 | Wt 166.0 lb

## 2017-03-31 DIAGNOSIS — Z3482 Encounter for supervision of other normal pregnancy, second trimester: Secondary | ICD-10-CM

## 2017-03-31 DIAGNOSIS — O3440 Maternal care for other abnormalities of cervix, unspecified trimester: Secondary | ICD-10-CM

## 2017-03-31 DIAGNOSIS — Z9889 Other specified postprocedural states: Secondary | ICD-10-CM

## 2017-03-31 DIAGNOSIS — Z3480 Encounter for supervision of other normal pregnancy, unspecified trimester: Secondary | ICD-10-CM

## 2017-03-31 DIAGNOSIS — O283 Abnormal ultrasonic finding on antenatal screening of mother: Secondary | ICD-10-CM

## 2017-03-31 DIAGNOSIS — O3442 Maternal care for other abnormalities of cervix, second trimester: Secondary | ICD-10-CM

## 2017-03-31 MED ORDER — PRENATAL 27-0.8 MG PO TABS: 1.0000 | ORAL_TABLET | Freq: Every day | ORAL | 10 refills | Status: AC

## 2017-03-31 NOTE — Progress Notes (Signed)
   PRENATAL VISIT NOTE  Subjective:  Courtney Farley is a 36 y.o. G3P2002 at [redacted]w[redacted]d being seen today for ongoing prenatal care.  She is currently monitored for the following issues for this high-risk pregnancy and has Supervision of other normal pregnancy, antepartum; Abnormal cervical Papanicolaou smear affecting pregnancy, antepartum; History of cervical LEEP biopsy affecting care of mother, antepartum; Maternal tobacco use, antepartum; Increased nuchal translucency space on fetal ultrasound; and Uterine fibroid during pregnancy, antepartum on her problem list.  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  Contractions: Irritability. Vag. Bleeding: None.  Movement: Present. Denies leaking of fluid.   The following portions of the patient's history were reviewed and updated as appropriate: allergies, current medications, past family history, past medical history, past social history, past surgical history and problem list. Problem list updated.  Objective:   Vitals:   03/31/17 1038  BP: 118/80  Pulse: (!) 101  Weight: 166 lb (75.3 kg)    Fetal Status: Fetal Heart Rate (bpm): 148   Movement: Present     General:  Alert, oriented and cooperative. Patient is in no acute distress.  Skin: Skin is warm and dry. No rash noted.   Cardiovascular: Normal heart rate noted  Respiratory: Normal respiratory effort, no problems with respiration noted  Abdomen: Soft, gravid, appropriate for gestational age.  Pain/Pressure: Present     Pelvic: Cervical exam deferred        Extremities: Normal range of motion.  Edema: Trace  Mental Status:  Normal mood and affect. Normal behavior. Normal judgment and thought content.   Assessment and Plan:  Pregnancy: G3P2002 at [redacted]w[redacted]d  1. Supervision of other normal pregnancy, antepartum FHT and FH normal. Tenderness to round ligament - discussed symptomatic relief - activity avoidance.  2. Increased nuchal  translucency space on fetal ultrasound F/u US for growth  3. History of cervical LEEP biopsy affecting care of mother, antepartum  Preterm labor symptoms and general obstetric precautions including but not limited to vaginal bleeding, contractions, leaking of fluid and fetal movement were reviewed in detail with the patient. Please refer to After Visit Summary for other counseling recommendations.  Return in about 4 weeks (around 04/28/2017) for OB f/u.   Truett Mainland, DO

## 2017-04-11 ENCOUNTER — Encounter: Payer: Self-pay | Admitting: Family Medicine

## 2017-04-11 ENCOUNTER — Ambulatory Visit (INDEPENDENT_AMBULATORY_CARE_PROVIDER_SITE_OTHER): Payer: Medicaid Other | Admitting: Family Medicine

## 2017-04-11 VITALS — BP 121/82 | HR 100 | Temp 97.8°F | Wt 169.0 lb

## 2017-04-11 DIAGNOSIS — Z3480 Encounter for supervision of other normal pregnancy, unspecified trimester: Secondary | ICD-10-CM

## 2017-04-11 DIAGNOSIS — Z3482 Encounter for supervision of other normal pregnancy, second trimester: Secondary | ICD-10-CM

## 2017-04-11 DIAGNOSIS — R1031 Right lower quadrant pain: Secondary | ICD-10-CM

## 2017-04-11 NOTE — Progress Notes (Signed)
   PRENATAL VISIT NOTE  Subjective:  Courtney Farley is a 36 y.o. G3P2002 at [redacted]w[redacted]d being seen today for ongoing prenatal care.  She is currently monitored for the following issues for this high-risk pregnancy and has Supervision of other normal pregnancy, antepartum; Abnormal cervical Papanicolaou smear affecting pregnancy, antepartum; History of cervical LEEP biopsy affecting care of mother, antepartum; Maternal tobacco use, antepartum; Increased nuchal translucency space on fetal ultrasound; and Uterine fibroid during pregnancy, antepartum on her problem list.  Patient reports right lower quadrant pain/inguinal pain that has been increasing over the past 3 days. Pain comes and goes - movement, laying on back, coughing, straining increase pain. Has noticed a bulge there. .  Contractions: Irritability. Vag. Bleeding: None.  Movement: Present. Denies leaking of fluid.   The following portions of the patient's history were reviewed and updated as appropriate: allergies, current medications, past family history, past medical history, past social history, past surgical history and problem list. Problem list updated.  Objective:   Vitals:   04/11/17 0907  BP: 121/82  Pulse: 100  Temp: 97.8 F (36.6 C)  Weight: 169 lb (76.7 kg)    Fetal Status: Fetal Heart Rate (bpm): 145   Movement: Present     General:  Alert, oriented and cooperative. Patient is in no acute distress.  Skin: Skin is warm and dry. No rash noted.   Cardiovascular: Normal heart rate noted  Respiratory: Normal respiratory effort, no problems with respiration noted  Abdomen: Soft, gravid, appropriate for gestational age.  Pain/Pressure: Present 4cmx2cm bulge just superior to inguinal canal on right side. No rebound tenderness.     Pelvic: Cervical exam deferred        Extremities: Normal range of motion.  Edema: None  Mental Status:  Normal mood and affect. Normal behavior. Normal judgment and thought content.   Assessment and  Plan:  Pregnancy: G3P2002 at [redacted]w[redacted]d  1. Supervision of other normal pregnancy, antepartum FHT normal  2. Inguinal pain, right With palpable mass, unlikely to appendix. ? Hernia or lymphnode. Will get MRI to evaluate. If increases, develops fevers, chills, nausea, pt to go to ED. - MR PELVIS WO CONTRAST; Future   Preterm labor symptoms and general obstetric precautions including but not limited to vaginal bleeding, contractions, leaking of fluid and fetal movement were reviewed in detail with the patient. Please refer to After Visit Summary for other counseling recommendations.  No Follow-up on file.   Truett Mainland, DO

## 2017-04-11 NOTE — Progress Notes (Signed)
Patient states she has a spot on lower right abdomen that has been hurting for three days. Kathrene Alu RNBSN

## 2017-04-12 ENCOUNTER — Ambulatory Visit (HOSPITAL_COMMUNITY)
Admission: RE | Admit: 2017-04-12 | Discharge: 2017-04-12 | Disposition: A | Discharge status: Home / Self Care | Payer: Medicaid Other | Source: Ambulatory Visit | Attending: Family Medicine | Admitting: Family Medicine

## 2017-04-12 DIAGNOSIS — D259 Leiomyoma of uterus, unspecified: Secondary | ICD-10-CM | POA: Insufficient documentation

## 2017-04-12 DIAGNOSIS — R1031 Right lower quadrant pain: Secondary | ICD-10-CM | POA: Insufficient documentation

## 2017-04-14 ENCOUNTER — Telehealth: Payer: Self-pay | Admitting: Family Medicine

## 2017-04-14 NOTE — Telephone Encounter (Signed)
Discussed MRI with patient - no evidence of lump or reason for discomfort. Trial of heat and ice. Patient thinks pain is a little better. If acutely worsens, pt to go to ED. If continues to have pain, then may need referral to gen surgery for eval.

## 2017-04-27 ENCOUNTER — Encounter (HOSPITAL_COMMUNITY): Payer: Self-pay

## 2017-04-27 ENCOUNTER — Ambulatory Visit (HOSPITAL_COMMUNITY)
Admission: RE | Admit: 2017-04-27 | Discharge: 2017-04-27 | Disposition: A | Discharge status: Home / Self Care | Payer: Medicaid Other | Source: Ambulatory Visit | Attending: Obstetrics & Gynecology | Admitting: Obstetrics & Gynecology

## 2017-04-27 DIAGNOSIS — D259 Leiomyoma of uterus, unspecified: Secondary | ICD-10-CM | POA: Diagnosis not present

## 2017-04-27 DIAGNOSIS — O359XX0 Maternal care for (suspected) fetal abnormality and damage, unspecified, not applicable or unspecified: Secondary | ICD-10-CM | POA: Diagnosis not present

## 2017-04-27 DIAGNOSIS — O3410 Maternal care for benign tumor of corpus uteri, unspecified trimester: Secondary | ICD-10-CM | POA: Diagnosis present

## 2017-04-27 DIAGNOSIS — Z3A25 25 weeks gestation of pregnancy: Secondary | ICD-10-CM | POA: Insufficient documentation

## 2017-04-27 DIAGNOSIS — O3412 Maternal care for benign tumor of corpus uteri, second trimester: Secondary | ICD-10-CM | POA: Diagnosis not present

## 2017-04-27 DIAGNOSIS — O3440 Maternal care for other abnormalities of cervix, unspecified trimester: Secondary | ICD-10-CM | POA: Diagnosis not present

## 2017-04-27 NOTE — ED Notes (Signed)
Pt reports 2 episodes of leaking clear fluid during the last week.  Denies bleeding.

## 2017-05-05 ENCOUNTER — Ambulatory Visit (INDEPENDENT_AMBULATORY_CARE_PROVIDER_SITE_OTHER): Payer: Medicaid Other | Admitting: Family Medicine

## 2017-05-05 VITALS — BP 112/74 | HR 88 | Wt 169.0 lb

## 2017-05-05 DIAGNOSIS — R1031 Right lower quadrant pain: Secondary | ICD-10-CM

## 2017-05-05 DIAGNOSIS — Z3490 Encounter for supervision of normal pregnancy, unspecified, unspecified trimester: Secondary | ICD-10-CM

## 2017-05-05 DIAGNOSIS — O26842 Uterine size-date discrepancy, second trimester: Secondary | ICD-10-CM

## 2017-05-05 DIAGNOSIS — Z3482 Encounter for supervision of other normal pregnancy, second trimester: Secondary | ICD-10-CM

## 2017-05-05 DIAGNOSIS — Z3480 Encounter for supervision of other normal pregnancy, unspecified trimester: Secondary | ICD-10-CM

## 2017-05-05 DIAGNOSIS — Z9889 Other specified postprocedural states: Secondary | ICD-10-CM

## 2017-05-05 DIAGNOSIS — O3442 Maternal care for other abnormalities of cervix, second trimester: Secondary | ICD-10-CM

## 2017-05-05 DIAGNOSIS — O3440 Maternal care for other abnormalities of cervix, unspecified trimester: Secondary | ICD-10-CM

## 2017-05-05 DIAGNOSIS — O283 Abnormal ultrasonic finding on antenatal screening of mother: Secondary | ICD-10-CM

## 2017-05-05 NOTE — Progress Notes (Signed)
   PRENATAL VISIT NOTE  Subjective:  Courtney Farley is a 36 y.o. G3P2002 at [redacted]w[redacted]d being seen today for ongoing prenatal care.  She is currently monitored for the following issues for this high-risk pregnancy and has Supervision of other normal pregnancy, antepartum; Abnormal cervical Papanicolaou smear affecting pregnancy, antepartum; History of cervical LEEP biopsy affecting care of mother, antepartum; Maternal tobacco use, antepartum; Increased nuchal translucency space on fetal ultrasound; and Uterine fibroid during pregnancy, antepartum on her problem list.  Patient reports no complaints.  Contractions: Irritability. Vag. Bleeding: None.  Movement: Present. Denies leaking of fluid.   The following portions of the patient's history were reviewed and updated as appropriate: allergies, current medications, past family history, past medical history, past social history, past surgical history and problem list. Problem list updated.  Objective:   Vitals:   05/05/17 0922  BP: 112/74  Pulse: 88  Weight: 169 lb (76.7 kg)    Fetal Status: Fetal Heart Rate (bpm): 140 Fundal Height: 30 cm Movement: Present     General:  Alert, oriented and cooperative. Patient is in no acute distress.  Skin: Skin is warm and dry. No rash noted.   Cardiovascular: Normal heart rate noted  Respiratory: Normal respiratory effort, no problems with respiration noted  Abdomen: Soft, gravid, appropriate for gestational age.  Pain/Pressure: Present     Pelvic: Cervical exam deferred        Extremities: Normal range of motion.  Edema: None  Mental Status:  Normal mood and affect. Normal behavior. Normal judgment and thought content.   Assessment and Plan:  Pregnancy: G3P2002 at [redacted]w[redacted]d  1. Prenatal care, antepartum - Glucose Tolerance, 2 Hours w/1 Hour - HIV antibody (with reflex) - RPR - CBC  2. Supervision of other normal pregnancy, antepartum FHT normal. FH larger than dates - will watch closely. Recent US  shows EFW in 80%.   3. Inguinal pain, right improved  4. Increased nuchal translucency space on fetal ultrasound Fetal echo normal  5. History of cervical LEEP biopsy affecting care of mother, antepartum  Preterm labor symptoms and general obstetric precautions including but not limited to vaginal bleeding, contractions, leaking of fluid and fetal movement were reviewed in detail with the patient. Please refer to After Visit Summary for other counseling recommendations.  Return in about 4 weeks (around 06/02/2017) for OB f/u.   Truett Mainland, DO

## 2017-05-06 LAB — CBC
Hematocrit: 35.1 % (ref 34.0–46.6)
Hemoglobin: 11.5 g/dL (ref 11.1–15.9)
MCH: 27.8 pg (ref 26.6–33.0)
MCHC: 32.8 g/dL (ref 31.5–35.7)
MCV: 85 fL (ref 79–97)
PLATELETS: 336 10*3/uL (ref 150–379)
RBC: 4.14 x10E6/uL (ref 3.77–5.28)
RDW: 13.9 % (ref 12.3–15.4)
WBC: 8.8 10*3/uL (ref 3.4–10.8)

## 2017-05-06 LAB — RPR: RPR: NONREACTIVE

## 2017-05-06 LAB — GLUCOSE TOLERANCE, 2 HOURS W/ 1HR
GLUCOSE, 1 HOUR: 134 mg/dL (ref 65–179)
GLUCOSE, FASTING: 66 mg/dL (ref 65–91)
Glucose, 2 hour: 68 mg/dL (ref 65–152)

## 2017-05-06 LAB — HIV ANTIBODY (ROUTINE TESTING W REFLEX): HIV Screen 4th Generation wRfx: NONREACTIVE

## 2017-06-02 ENCOUNTER — Ambulatory Visit (INDEPENDENT_AMBULATORY_CARE_PROVIDER_SITE_OTHER): Payer: Medicaid Other | Admitting: Family Medicine

## 2017-06-02 VITALS — BP 124/80 | HR 102 | Wt 171.0 lb

## 2017-06-02 DIAGNOSIS — O35BXX0 Maternal care for other (suspected) fetal abnormality and damage, fetal cardiac anomalies, not applicable or unspecified: Secondary | ICD-10-CM | POA: Insufficient documentation

## 2017-06-02 DIAGNOSIS — O26843 Uterine size-date discrepancy, third trimester: Secondary | ICD-10-CM

## 2017-06-02 DIAGNOSIS — O26849 Uterine size-date discrepancy, unspecified trimester: Secondary | ICD-10-CM | POA: Insufficient documentation

## 2017-06-02 DIAGNOSIS — O3443 Maternal care for other abnormalities of cervix, third trimester: Secondary | ICD-10-CM

## 2017-06-02 DIAGNOSIS — Z3483 Encounter for supervision of other normal pregnancy, third trimester: Secondary | ICD-10-CM

## 2017-06-02 DIAGNOSIS — O358XX0 Maternal care for other (suspected) fetal abnormality and damage, not applicable or unspecified: Secondary | ICD-10-CM

## 2017-06-02 DIAGNOSIS — O3440 Maternal care for other abnormalities of cervix, unspecified trimester: Secondary | ICD-10-CM

## 2017-06-02 DIAGNOSIS — O09521 Supervision of elderly multigravida, first trimester: Secondary | ICD-10-CM

## 2017-06-02 DIAGNOSIS — Z9889 Other specified postprocedural states: Secondary | ICD-10-CM

## 2017-06-02 DIAGNOSIS — O09523 Supervision of elderly multigravida, third trimester: Secondary | ICD-10-CM

## 2017-06-02 DIAGNOSIS — Z3480 Encounter for supervision of other normal pregnancy, unspecified trimester: Secondary | ICD-10-CM

## 2017-06-02 NOTE — Progress Notes (Signed)
   PRENATAL VISIT NOTE  Subjective:  Courtney Farley is a 36 y.o. G3P2002 at [redacted]w[redacted]d being seen today for ongoing prenatal care.  She is currently monitored for the following issues for this high-risk pregnancy and has Supervision of other normal pregnancy, antepartum; Abnormal cervical Papanicolaou smear affecting pregnancy, antepartum; History of cervical LEEP biopsy affecting care of mother, antepartum; Maternal tobacco use, antepartum; Increased nuchal translucency space on fetal ultrasound; Uterine fibroid during pregnancy, antepartum; Abnormal fetal echocardiogram, affecting care of mother, antepartum; and Uterine size date discrepancy, antepartum on her problem list.  Patient reports occasional contractions.  Contractions: Irritability. Vag. Bleeding: None.  Movement: Present. Denies leaking of fluid.   The following portions of the patient's history were reviewed and updated as appropriate: allergies, current medications, past family history, past medical history, past social history, past surgical history and problem list. Problem list updated.  Objective:   Vitals:   06/02/17 0920  BP: 124/80  Pulse: (!) 102  Weight: 171 lb (77.6 kg)    Fetal Status: Fetal Heart Rate (bpm): 140 Fundal Height: 35 cm Movement: Present     General:  Alert, oriented and cooperative. Patient is in no acute distress.  Skin: Skin is warm and dry. No rash noted.   Cardiovascular: Normal heart rate noted  Respiratory: Normal respiratory effort, no problems with respiration noted  Abdomen: Soft, gravid, appropriate for gestational age.  Pain/Pressure: Present     Pelvic: Cervical exam deferred        Extremities: Normal range of motion.  Edema: None  Mental Status:  Normal mood and affect. Normal behavior. Normal judgment and thought content.   Assessment and Plan:  Pregnancy: G3P2002 at [redacted]w[redacted]d  1. Supervision of other normal pregnancy, antepartum FHT and FH normal  2. Abnormal fetal echocardiography  affecting antepartum care of mother, single or unspecified fetus Small to moderate perimembranous vs supracristal ventricular septal defect. No barriers for delivery at [facility]. Recommend echo following delivery.   3. Uterine size date discrepancy, antepartum F/u US  4. Elderly multigravida in first trimester  5. History of cervical LEEP biopsy affecting care of mother, antepartum   Preterm labor symptoms and general obstetric precautions including but not limited to vaginal bleeding, contractions, leaking of fluid and fetal movement were reviewed in detail with the patient. Please refer to After Visit Summary for other counseling recommendations.  No Follow-up on file.   Truett Mainland, DO

## 2017-06-08 ENCOUNTER — Encounter (HOSPITAL_COMMUNITY): Payer: Self-pay

## 2017-06-08 ENCOUNTER — Other Ambulatory Visit: Payer: Self-pay | Admitting: Family Medicine

## 2017-06-08 ENCOUNTER — Other Ambulatory Visit (HOSPITAL_COMMUNITY): Payer: Self-pay | Admitting: *Deleted

## 2017-06-08 ENCOUNTER — Ambulatory Visit (HOSPITAL_COMMUNITY)
Admission: RE | Admit: 2017-06-08 | Discharge: 2017-06-08 | Disposition: A | Discharge status: Home / Self Care | Payer: Medicaid Other | Source: Ambulatory Visit | Attending: Family Medicine | Admitting: Family Medicine

## 2017-06-08 DIAGNOSIS — O359XX0 Maternal care for (suspected) fetal abnormality and damage, unspecified, not applicable or unspecified: Secondary | ICD-10-CM

## 2017-06-08 DIAGNOSIS — O09893 Supervision of other high risk pregnancies, third trimester: Secondary | ICD-10-CM | POA: Diagnosis not present

## 2017-06-08 DIAGNOSIS — O3440 Maternal care for other abnormalities of cervix, unspecified trimester: Secondary | ICD-10-CM | POA: Insufficient documentation

## 2017-06-08 DIAGNOSIS — O26843 Uterine size-date discrepancy, third trimester: Secondary | ICD-10-CM | POA: Insufficient documentation

## 2017-06-08 DIAGNOSIS — O358XX0 Maternal care for other (suspected) fetal abnormality and damage, not applicable or unspecified: Secondary | ICD-10-CM

## 2017-06-08 DIAGNOSIS — Q249 Congenital malformation of heart, unspecified: Secondary | ICD-10-CM | POA: Insufficient documentation

## 2017-06-08 DIAGNOSIS — D259 Leiomyoma of uterus, unspecified: Secondary | ICD-10-CM | POA: Insufficient documentation

## 2017-06-08 DIAGNOSIS — O26849 Uterine size-date discrepancy, unspecified trimester: Secondary | ICD-10-CM

## 2017-06-08 DIAGNOSIS — Z3A31 31 weeks gestation of pregnancy: Secondary | ICD-10-CM

## 2017-06-08 DIAGNOSIS — O35BXX0 Maternal care for other (suspected) fetal abnormality and damage, fetal cardiac anomalies, not applicable or unspecified: Secondary | ICD-10-CM

## 2017-06-08 DIAGNOSIS — O3413 Maternal care for benign tumor of corpus uteri, third trimester: Secondary | ICD-10-CM | POA: Insufficient documentation

## 2017-06-08 NOTE — ED Notes (Signed)
Pt reports leaking clear watery fluid x several weeks.  Denies bleeding.

## 2017-06-09 ENCOUNTER — Ambulatory Visit (INDEPENDENT_AMBULATORY_CARE_PROVIDER_SITE_OTHER): Payer: Medicaid Other | Admitting: Obstetrics & Gynecology

## 2017-06-09 ENCOUNTER — Other Ambulatory Visit (HOSPITAL_COMMUNITY)
Admission: RE | Admit: 2017-06-09 | Discharge: 2017-06-09 | Disposition: A | Discharge status: Home / Self Care | Payer: Medicaid Other | Source: Ambulatory Visit | Attending: Obstetrics & Gynecology | Admitting: Obstetrics & Gynecology

## 2017-06-09 VITALS — BP 115/72 | HR 102 | Wt 173.0 lb

## 2017-06-09 DIAGNOSIS — O26893 Other specified pregnancy related conditions, third trimester: Secondary | ICD-10-CM | POA: Diagnosis not present

## 2017-06-09 DIAGNOSIS — Z113 Encounter for screening for infections with a predominantly sexual mode of transmission: Secondary | ICD-10-CM | POA: Diagnosis not present

## 2017-06-09 DIAGNOSIS — N898 Other specified noninflammatory disorders of vagina: Secondary | ICD-10-CM | POA: Diagnosis not present

## 2017-06-09 DIAGNOSIS — Z3480 Encounter for supervision of other normal pregnancy, unspecified trimester: Secondary | ICD-10-CM

## 2017-06-09 DIAGNOSIS — Z3483 Encounter for supervision of other normal pregnancy, third trimester: Secondary | ICD-10-CM

## 2017-06-09 NOTE — Patient Instructions (Signed)
Return to clinic for any scheduled appointments or obstetric concerns, or go to [facility] for evaluation  

## 2017-06-09 NOTE — Progress Notes (Signed)
   PRENATAL VISIT NOTE  Subjective:  Courtney Farley is a 36 y.o. G3P2002 at [redacted]w[redacted]d being seen today for ongoing prenatal care.  She is currently monitored for the following issues for this low-risk pregnancy and has Supervision of other normal pregnancy, antepartum; Abnormal cervical Papanicolaou smear affecting pregnancy, antepartum; History of cervical LEEP biopsy affecting care of mother, antepartum; Maternal tobacco use, antepartum; Increased nuchal translucency space on fetal ultrasound; Uterine fibroid during pregnancy, antepartum; Abnormal fetal echocardiogram, affecting care of mother, antepartum; and Uterine size date discrepancy, antepartum on her problem list.  Patient reports clear-white vaginal discharge for weeks. No irritation, just wants to make sure there is no infection,. She is not worried about breaking her bag of water.  Contractions: Irritability. Vag. Bleeding: None.  Movement: Present. Denies leaking of fluid.   The following portions of the patient's history were reviewed and updated as appropriate: allergies, current medications, past family history, past medical history, past social history, past surgical history and problem list. Problem list updated.  Objective:   Vitals:   06/09/17 1012  BP: 115/72  Pulse: (!) 102  Weight: 173 lb (78.5 kg)    Fetal Status: Fetal Heart Rate (bpm): 145 Fundal Height: 31 cm Movement: Present     General:  Alert, oriented and cooperative. Patient is in no acute distress.  Skin: Skin is warm and dry. No rash noted.   Cardiovascular: Normal heart rate noted  Respiratory: Normal respiratory effort, no problems with respiration noted  Abdomen: Soft, gravid, appropriate for gestational age.  Pain/Pressure: Present     Pelvic: Cervical exam deferred      On vaginal exam, no pooling of fluid, small amount of clear discharge seen, no odor. Sample obtained for testing.  Extremities: Normal range of motion.  Edema: None  Mental Status:   Normal mood and affect. Normal behavior. Normal judgment and thought content.   Assessment and Plan:  Pregnancy: G3P2002 at [redacted]w[redacted]d  1. Vaginal discharge during pregnancy in third trimester Normal exam but will follow up test results and manage accordingly. No evidence of ROM. Likely physiologic discharge in third trimester. - Cervicovaginal ancillary only  2. Supervision of other normal pregnancy, antepartum Preterm labor symptoms and general obstetric precautions including but not limited to vaginal bleeding, contractions, leaking of fluid and fetal movement were reviewed in detail with the patient. Please refer to After Visit Summary for other counseling recommendations.  Return in about 2 weeks (around 06/23/2017) for OB Visit.   Verita Schneiders, MD

## 2017-06-09 NOTE — Progress Notes (Signed)
Patient has had on going discharge. Kathrene Alu RNBSN

## 2017-06-13 LAB — CERVICOVAGINAL ANCILLARY ONLY
Bacterial vaginitis: NEGATIVE
Candida vaginitis: NEGATIVE
Chlamydia: NEGATIVE
Neisseria Gonorrhea: NEGATIVE
Trichomonas: NEGATIVE

## 2017-06-16 ENCOUNTER — Encounter: Payer: Medicaid Other | Admitting: Family Medicine

## 2017-06-23 ENCOUNTER — Ambulatory Visit (INDEPENDENT_AMBULATORY_CARE_PROVIDER_SITE_OTHER): Payer: Medicaid Other | Admitting: Family Medicine

## 2017-06-23 VITALS — BP 118/71 | HR 100 | Wt 174.0 lb

## 2017-06-23 DIAGNOSIS — O26849 Uterine size-date discrepancy, unspecified trimester: Secondary | ICD-10-CM

## 2017-06-23 DIAGNOSIS — O358XX0 Maternal care for other (suspected) fetal abnormality and damage, not applicable or unspecified: Secondary | ICD-10-CM

## 2017-06-23 DIAGNOSIS — Z3480 Encounter for supervision of other normal pregnancy, unspecified trimester: Secondary | ICD-10-CM

## 2017-06-23 DIAGNOSIS — O35BXX0 Maternal care for other (suspected) fetal abnormality and damage, fetal cardiac anomalies, not applicable or unspecified: Secondary | ICD-10-CM

## 2017-06-23 NOTE — Progress Notes (Signed)
   PRENATAL VISIT NOTE  Subjective:  Courtney Farley is a 36 y.o. G3P2002 at [redacted]w[redacted]d being seen today for ongoing prenatal care.  She is currently monitored for the following issues for this high-risk pregnancy and has Supervision of other normal pregnancy, antepartum; Abnormal cervical Papanicolaou smear affecting pregnancy, antepartum; History of cervical LEEP biopsy affecting care of mother, antepartum; Maternal tobacco use, antepartum; Increased nuchal translucency space on fetal ultrasound; Uterine fibroid during pregnancy, antepartum; Abnormal fetal echocardiogram, affecting care of mother, antepartum; and Uterine size date discrepancy, antepartum on her problem list.  Patient reports no complaints.  Contractions: Irritability. Vag. Bleeding: None.  Movement: Present. Denies leaking of fluid.   The following portions of the patient's history were reviewed and updated as appropriate: allergies, current medications, past family history, past medical history, past social history, past surgical history and problem list. Problem list updated.  Objective:   Vitals:   06/23/17 1056  BP: 118/71  Pulse: 100  Weight: 174 lb (78.9 kg)    Fetal Status: Fetal Heart Rate (bpm): 140   Movement: Present     General:  Alert, oriented and cooperative. Patient is in no acute distress.  Skin: Skin is warm and dry. No rash noted.   Cardiovascular: Normal heart rate noted  Respiratory: Normal respiratory effort, no problems with respiration noted  Abdomen: Soft, gravid, appropriate for gestational age.  Pain/Pressure: Present     Pelvic: Cervical exam deferred        Extremities: Normal range of motion.  Edema: None  Mental Status:  Normal mood and affect. Normal behavior. Normal judgment and thought content.   Assessment and Plan:  Pregnancy: G3P2002 at [redacted]w[redacted]d  1. Supervision of other normal pregnancy, antepartum FHT and FH normal  2. Abnormal fetal echocardiography affecting antepartum care of  mother, single or unspecified fetus Postpartum evaluation  3. Uterine size date discrepancy, antepartum EFW on 10/10 4#2oz (70%).  Preterm labor symptoms and general obstetric precautions including but not limited to vaginal bleeding, contractions, leaking of fluid and fetal movement were reviewed in detail with the patient. Please refer to After Visit Summary for other counseling recommendations.  Return in about 2 weeks (around 07/07/2017) for OB f/u.   Truett Mainland, DO

## 2017-07-06 ENCOUNTER — Other Ambulatory Visit (HOSPITAL_COMMUNITY): Payer: Self-pay | Admitting: Maternal and Fetal Medicine

## 2017-07-06 ENCOUNTER — Ambulatory Visit (HOSPITAL_COMMUNITY)
Admission: RE | Admit: 2017-07-06 | Discharge: 2017-07-06 | Disposition: A | Discharge status: Home / Self Care | Payer: Medicaid Other | Source: Ambulatory Visit | Attending: Family Medicine | Admitting: Family Medicine

## 2017-07-06 ENCOUNTER — Encounter (HOSPITAL_COMMUNITY): Payer: Self-pay

## 2017-07-06 DIAGNOSIS — Z3A35 35 weeks gestation of pregnancy: Secondary | ICD-10-CM | POA: Diagnosis not present

## 2017-07-06 DIAGNOSIS — D259 Leiomyoma of uterus, unspecified: Secondary | ICD-10-CM | POA: Diagnosis not present

## 2017-07-06 DIAGNOSIS — O3413 Maternal care for benign tumor of corpus uteri, third trimester: Secondary | ICD-10-CM | POA: Diagnosis not present

## 2017-07-06 DIAGNOSIS — O358XX0 Maternal care for other (suspected) fetal abnormality and damage, not applicable or unspecified: Secondary | ICD-10-CM | POA: Insufficient documentation

## 2017-07-06 DIAGNOSIS — O359XX0 Maternal care for (suspected) fetal abnormality and damage, unspecified, not applicable or unspecified: Secondary | ICD-10-CM

## 2017-07-12 ENCOUNTER — Ambulatory Visit (INDEPENDENT_AMBULATORY_CARE_PROVIDER_SITE_OTHER): Payer: Medicaid Other | Admitting: Advanced Practice Midwife

## 2017-07-12 ENCOUNTER — Other Ambulatory Visit (HOSPITAL_COMMUNITY)
Admission: RE | Admit: 2017-07-12 | Discharge: 2017-07-12 | Disposition: A | Discharge status: Home / Self Care | Payer: Medicaid Other | Source: Ambulatory Visit | Attending: Advanced Practice Midwife | Admitting: Advanced Practice Midwife

## 2017-07-12 ENCOUNTER — Encounter: Payer: Self-pay | Admitting: Advanced Practice Midwife

## 2017-07-12 VITALS — BP 123/75 | HR 86 | Wt 177.0 lb

## 2017-07-12 DIAGNOSIS — Z3490 Encounter for supervision of normal pregnancy, unspecified, unspecified trimester: Secondary | ICD-10-CM

## 2017-07-12 DIAGNOSIS — L299 Pruritus, unspecified: Secondary | ICD-10-CM | POA: Insufficient documentation

## 2017-07-12 DIAGNOSIS — Z3493 Encounter for supervision of normal pregnancy, unspecified, third trimester: Secondary | ICD-10-CM | POA: Insufficient documentation

## 2017-07-12 DIAGNOSIS — Z3483 Encounter for supervision of other normal pregnancy, third trimester: Secondary | ICD-10-CM

## 2017-07-12 LAB — OB RESULTS CONSOLE GBS: STREP GROUP B AG: NEGATIVE

## 2017-07-12 LAB — OB RESULTS CONSOLE GC/CHLAMYDIA: GC PROBE AMP, GENITAL: NEGATIVE

## 2017-07-12 NOTE — Patient Instructions (Signed)

## 2017-07-12 NOTE — Progress Notes (Signed)
PRENATAL VISIT NOTE  Subjective:  Courtney Farley is a 36 y.o. G3P2002 at 38w0dbeing seen today for ongoing prenatal care.  She is currently monitored for the following issues for this high-risk pregnancy and has Supervision of other normal pregnancy, antepartum; Abnormal cervical Papanicolaou smear affecting pregnancy, antepartum; History of cervical LEEP biopsy affecting care of mother, antepartum; Maternal tobacco use, antepartum; Increased nuchal translucency space on fetal ultrasound; Uterine fibroid during pregnancy, antepartum; Abnormal fetal echocardiogram, affecting care of mother, antepartum; Uterine size date discrepancy, antepartum; and Itching on their problem list.  Patient reports Itching all over.  Patient was using a hairbrush to scratch with, diffusely.  Contractions: Irritability. Vag. Bleeding: None.  Movement: Present. Denies leaking of fluid.   The following portions of the patient's history were reviewed and updated as appropriate: allergies, current medications, past family history, past medical history, past social history, past surgical history and problem list. Problem list updated.  Objective:   Vitals:   07/12/17 1030  BP: 123/75  Pulse: 86  Weight: 177 lb (80.3 kg)    Fetal Status:     Movement: Present     General:  Alert, oriented and cooperative. Patient is in no acute distress.  Skin: Skin is warm and dry. No rash noted.   Cardiovascular: Normal heart rate noted  Respiratory: Normal respiratory effort, no problems with respiration noted  Abdomen: Soft, gravid, appropriate for gestational age.  Pain/Pressure: Present     Pelvic: Cervical exam performed      1+/70/-3/ballotable  Extremities: Normal range of motion.  Edema: None  Mental Status:  Normal mood and affect. Normal behavior. Normal judgment and thought content.   Assessment and Plan:  Pregnancy: G3P2002 at 357w0d1. Prenatal care, antepartum     Labor precautions     Reviewed MAU and  hospital procedures.  Patient seemed surprised that she would not necessarily know the person delivering her.  Discussed practice and how we cover labor unit.   - [facility], beta strep (group b only) - Bile acids, total - Comp Met (CMET)  2. Itching      Labs ordered      Suspect Cholestasis - Bile acids, total - Comp Met (CMET)  Preterm labor symptoms and general obstetric precautions including but not limited to vaginal bleeding, contractions, leaking of fluid and fetal movement were reviewed in detail with the patient. Please refer to After Visit Summary for other counseling recommendations.  RTO 1 week  MaHansel FeinsteinCNM

## 2017-07-13 LAB — GC/CHLAMYDIA PROBE AMP (~~LOC~~) NOT AT ARMC
CHLAMYDIA, DNA PROBE: NEGATIVE
NEISSERIA GONORRHEA: NEGATIVE

## 2017-07-14 LAB — COMPREHENSIVE METABOLIC PANEL
A/G RATIO: 1.3 (ref 1.2–2.2)
ALT: 11 IU/L (ref 0–32)
AST: 15 IU/L (ref 0–40)
Albumin: 3.5 g/dL (ref 3.5–5.5)
Alkaline Phosphatase: 101 IU/L (ref 39–117)
BILIRUBIN TOTAL: 0.3 mg/dL (ref 0.0–1.2)
BUN/Creatinine Ratio: 10 (ref 9–23)
BUN: 7 mg/dL (ref 6–20)
CALCIUM: 9.1 mg/dL (ref 8.7–10.2)
CHLORIDE: 99 mmol/L (ref 96–106)
CO2: 22 mmol/L (ref 20–29)
Creatinine, Ser: 0.69 mg/dL (ref 0.57–1.00)
GFR calc Af Amer: 131 mL/min/{1.73_m2} (ref >59)
GFR, EST NON AFRICAN AMERICAN: 114 mL/min/{1.73_m2} (ref >59)
GLUCOSE: 85 mg/dL (ref 65–99)
Globulin, Total: 2.6 g/dL (ref 1.5–4.5)
Potassium: 4.3 mmol/L (ref 3.5–5.2)
Sodium: 136 mmol/L (ref 134–144)
Total Protein: 6.1 g/dL (ref 6.0–8.5)

## 2017-07-14 LAB — BILE ACIDS, TOTAL: BILE ACIDS TOTAL: 4.4 umol/L — AB (ref 4.7–24.5)

## 2017-07-16 LAB — CULTURE, BETA STREP (GROUP B ONLY): Strep Gp B Culture: NEGATIVE

## 2017-07-19 ENCOUNTER — Inpatient Hospital Stay (EMERGENCY_DEPARTMENT_HOSPITAL)
Admission: AD | Admit: 2017-07-19 | Discharge: 2017-07-19 | Disposition: A | Discharge status: Home / Self Care | Payer: Medicaid Other | Source: Ambulatory Visit | Attending: Obstetrics and Gynecology | Admitting: Obstetrics and Gynecology

## 2017-07-19 ENCOUNTER — Ambulatory Visit (INDEPENDENT_AMBULATORY_CARE_PROVIDER_SITE_OTHER): Payer: Medicaid Other | Admitting: Advanced Practice Midwife

## 2017-07-19 ENCOUNTER — Encounter: Payer: Self-pay | Admitting: Advanced Practice Midwife

## 2017-07-19 ENCOUNTER — Encounter (HOSPITAL_COMMUNITY): Payer: Self-pay

## 2017-07-19 VITALS — BP 122/88 | HR 106 | Wt 177.0 lb

## 2017-07-19 DIAGNOSIS — O163 Unspecified maternal hypertension, third trimester: Secondary | ICD-10-CM

## 2017-07-19 DIAGNOSIS — R0989 Other specified symptoms and signs involving the circulatory and respiratory systems: Secondary | ICD-10-CM

## 2017-07-19 DIAGNOSIS — D259 Leiomyoma of uterus, unspecified: Secondary | ICD-10-CM

## 2017-07-19 DIAGNOSIS — O283 Abnormal ultrasonic finding on antenatal screening of mother: Secondary | ICD-10-CM

## 2017-07-19 DIAGNOSIS — Z3480 Encounter for supervision of other normal pregnancy, unspecified trimester: Secondary | ICD-10-CM

## 2017-07-19 DIAGNOSIS — O3410 Maternal care for benign tumor of corpus uteri, unspecified trimester: Secondary | ICD-10-CM

## 2017-07-19 LAB — URINALYSIS, ROUTINE W REFLEX MICROSCOPIC
Bilirubin Urine: NEGATIVE
GLUCOSE, UA: 50 mg/dL — AB
HGB URINE DIPSTICK: NEGATIVE
Ketones, ur: NEGATIVE mg/dL
LEUKOCYTES UA: NEGATIVE
Nitrite: NEGATIVE
Protein, ur: NEGATIVE mg/dL
SPECIFIC GRAVITY, URINE: 1.021 (ref 1.005–1.030)
pH: 5 (ref 5.0–8.0)

## 2017-07-19 LAB — COMPREHENSIVE METABOLIC PANEL
ALT: 19 U/L (ref 14–54)
AST: 25 U/L (ref 15–41)
Albumin: 2.9 g/dL — ABNORMAL LOW (ref 3.5–5.0)
Alkaline Phosphatase: 104 U/L (ref 38–126)
Anion gap: 9 (ref 5–15)
BUN: 10 mg/dL (ref 6–20)
CO2: 21 mmol/L — ABNORMAL LOW (ref 22–32)
Calcium: 8.8 mg/dL — ABNORMAL LOW (ref 8.9–10.3)
Chloride: 104 mmol/L (ref 101–111)
Creatinine, Ser: 0.65 mg/dL (ref 0.44–1.00)
GFR calc Af Amer: >60 mL/min (ref >60)
GFR calc non Af Amer: >60 mL/min (ref >60)
Glucose, Bld: 131 mg/dL — ABNORMAL HIGH (ref 65–99)
Potassium: 3.7 mmol/L (ref 3.5–5.1)
Sodium: 134 mmol/L — ABNORMAL LOW (ref 135–145)
Total Bilirubin: 0.2 mg/dL — ABNORMAL LOW (ref 0.3–1.2)
Total Protein: 6.8 g/dL (ref 6.5–8.1)

## 2017-07-19 LAB — PROTEIN / CREATININE RATIO, URINE
CREATININE, URINE: 194 mg/dL
PROTEIN CREATININE RATIO: 0.07 mg/mg{creat} (ref 0.00–0.15)
Total Protein, Urine: 14 mg/dL

## 2017-07-19 LAB — CBC
HCT: 36.1 % (ref 36.0–46.0)
Hemoglobin: 11.9 g/dL — ABNORMAL LOW (ref 12.0–15.0)
MCH: 27.7 pg (ref 26.0–34.0)
MCHC: 33 g/dL (ref 30.0–36.0)
MCV: 84.1 fL (ref 78.0–100.0)
Platelets: 313 10*3/uL (ref 150–400)
RBC: 4.29 MIL/uL (ref 3.87–5.11)
RDW: 13.6 % (ref 11.5–15.5)
WBC: 8.1 10*3/uL (ref 4.0–10.5)

## 2017-07-19 MED ORDER — HYDROXYZINE HCL 25 MG PO TABS: 25.0000 mg | ORAL_TABLET | Freq: Four times a day (QID) | ORAL | 2 refills | Status: AC | PRN

## 2017-07-19 NOTE — MAU Provider Note (Signed)
Chief Complaint:  Hypertension   First Provider Initiated Contact with Patient 07/19/17 1717      HPI: Courtney Farley is a 36 y.o. G3P2002 at 68w0dwho presents to maternity admissions reporting Hypertension. Reports she was seen at her prenatal appointment today where it was mildly elevated, was told to go home and take it again at a later time. It was elevated at home, when she called the office and was told to come in to MAU to be evaluated. She reports a headache that started earlier today, has hx of migraines and declines any medication for her headache due to rating it a 2/10. She denies any abnormal swelling and epigastric pain. She reports good fetal movement, denies LOF, vaginal bleeding, vaginal itching/burning, urinary symptoms, dizziness, n/v, or fever/chills.    Past Medical History: Past Medical History:  Diagnosis Date  . HPV in female   . Migraine     Past obstetric history: OB History  Gravida Para Term Preterm AB Living  3 2 2     2   SAB TAB Ectopic Multiple Live Births               # Outcome Date GA Lbr Len/2nd Weight Sex Delivery Anes PTL Lv  3 Current           2 Term           1 Term               Past Surgical History: Past Surgical History:  Procedure Laterality Date  . GANGLION CYST EXCISION    . LEEP      Family History: Family History  Problem Relation Age of Onset  . Hypertension Mother   . Hypertension Father   . Congenital adrenal hyperplasia Other   . Congenital adrenal hyperplasia Other     Social History: Social History   Tobacco Use  . Smoking status: Former Smoker    Years: 1.00    Types: Cigars  . Smokeless tobacco: Never Used  Substance Use Topics  . Alcohol use: No  . Drug use: No    Allergies:  Allergies  Allergen Reactions  . Erythromycin     Stomach cramps    Meds:  Medications Prior to Admission  Medication Sig Dispense Refill Last Dose  . hydrOXYzine (ATARAX/VISTARIL) 25 MG tablet Take 1 tablet (25 mg total)  by mouth every 6 (six) hours as needed for itching. 30 tablet 2   . Lactobacillus-Inulin (Sidon) CAPS Take 2 capsules by mouth as needed. yeast    Taking  . nadolol (CORGARD) 40 MG tablet Take 40 mg by mouth daily.     Taking  . Prenatal Vit-Fe Fumarate-FA (MULTIVITAMIN-PRENATAL) 27-0.8 MG TABS tablet Take 1 tablet by mouth daily at 12 noon. 30 each 10 Taking  . SUMAtriptan (IMITREX) 100 MG tablet Take 100 mg by mouth every 2 (two) hours as needed. Migraine    Taking    ROS:  Review of Systems  Constitutional: Negative for chills, diaphoresis, fatigue and fever.  Respiratory: Negative for cough, chest tightness, shortness of breath and wheezing.   Cardiovascular: Negative for chest pain and leg swelling.  Gastrointestinal: Negative for abdominal pain, diarrhea, nausea and vomiting.  Genitourinary: Negative for difficulty urinating, dysuria, flank pain, frequency, pelvic pain, urgency, vaginal bleeding, vaginal discharge and vaginal pain.  Musculoskeletal: Negative for back pain, joint swelling and neck pain.  Neurological: Positive for headaches. Negative for weakness and light-headedness.  Psychiatric/Behavioral: Negative for agitation, behavioral  problems and confusion.  All other systems reviewed and are negative.  I have reviewed patient's Past Medical Hx, Surgical Hx, Family Hx, Social Hx, medications and allergies.   Physical Exam   Patient Vitals for the past 24 hrs:  BP Temp Temp src Pulse Resp SpO2 Height Weight  07/19/17 1859 132/87 - - - - - - -  07/19/17 1816 (!) 114/97 - - (!) 116 - 99 % - -  07/19/17 1801 (!) 137/95 - - (!) 115 - 99 % - -  07/19/17 1746 130/83 - - (!) 118 - 98 % - -  07/19/17 1731 120/85 - - (!) 124 - 99 % - -  07/19/17 1701 123/83 - - (!) 127 - - - -  07/19/17 1658 122/78 - - (!) 128 - 98 % - -  07/19/17 1657 (!) 135/92 - - (!) 128 - 99 % - -  07/19/17 1642 131/88 98.2 F (36.8 C) Oral (!) 132 18 100 % 5\' 4"  (1.626 m) 178 lb  (80.7 kg)   Constitutional: Well-developed, well-nourished female in no acute distress.  Cardiovascular: normal rate Respiratory: normal effort GI: Abd soft, non-tender, gravid appropriate for gestational age.  MS: Extremities nontender, no edema, normal ROM Neurologic: Alert and oriented x 4.  GU: Neg CVAT.  FHT:  Baseline 140 , moderate variability, accelerations present, no decelerations Contractions: occasional contractions, mild, 80-100 seconds    Labs: Results for orders placed or performed during the hospital encounter of 07/19/17 (from the past 24 hour(s))  Urinalysis, Routine w reflex microscopic     Status: Abnormal   Collection Time: 07/19/17  4:40 PM  Result Value Ref Range   Color, Urine YELLOW YELLOW   APPearance CLOUDY (A) CLEAR   Specific Gravity, Urine 1.021 1.005 - 1.030   pH 5.0 5.0 - 8.0   Glucose, UA 50 (A) NEGATIVE mg/dL   Hgb urine dipstick NEGATIVE NEGATIVE   Bilirubin Urine NEGATIVE NEGATIVE   Ketones, ur NEGATIVE NEGATIVE mg/dL   Protein, ur NEGATIVE NEGATIVE mg/dL   Nitrite NEGATIVE NEGATIVE   Leukocytes, UA NEGATIVE NEGATIVE  Protein / creatinine ratio, urine     Status: None   Collection Time: 07/19/17  4:40 PM  Result Value Ref Range   Creatinine, Urine 194.00 mg/dL   Total Protein, Urine 14 mg/dL   Protein Creatinine Ratio 0.07 0.00 - 0.15 mg/mg[Cre]  CBC     Status: Abnormal   Collection Time: 07/19/17  5:54 PM  Result Value Ref Range   WBC 8.1 4.0 - 10.5 K/uL   RBC 4.29 3.87 - 5.11 MIL/uL   Hemoglobin 11.9 (L) 12.0 - 15.0 g/dL   HCT 36.1 36.0 - 46.0 %   MCV 84.1 78.0 - 100.0 fL   MCH 27.7 26.0 - 34.0 pg   MCHC 33.0 30.0 - 36.0 g/dL   RDW 13.6 11.5 - 15.5 %   Platelets 313 150 - 400 K/uL  Comprehensive metabolic panel     Status: Abnormal   Collection Time: 07/19/17  5:54 PM  Result Value Ref Range   Sodium 134 (L) 135 - 145 mmol/L   Potassium 3.7 3.5 - 5.1 mmol/L   Chloride 104 101 - 111 mmol/L   CO2 21 (L) 22 - 32 mmol/L    Glucose, Bld 131 (H) 65 - 99 mg/dL   BUN 10 6 - 20 mg/dL   Creatinine, Ser 0.65 0.44 - 1.00 mg/dL   Calcium 8.8 (L) 8.9 - 10.3 mg/dL   Total Protein  6.8 6.5 - 8.1 g/dL   Albumin 2.9 (L) 3.5 - 5.0 g/dL   AST 25 15 - 41 U/L   ALT 19 14 - 54 U/L   Alkaline Phosphatase 104 38 - 126 U/L   Total Bilirubin 0.2 (L) 0.3 - 1.2 mg/dL   GFR calc non Af Amer >60 >60 mL/min   GFR calc Af Amer >60 >60 mL/min   Anion gap 9 5 - 15   O/Positive/-- (05/21 1134)  MAU Course/MDM: I have ordered labs and reviewed results.  CBC CMP Protein/Creatnine ratio  Urinalysis  NST reviewed Consult Dr Nehemiah Settle with exam findings and test results. Will schedule walk in appointment tomorrow in clinic for blood pressure check. Patient mild tachycardia brought down with hydration- records show her normal ranges up to 106 Pt discharge with strict preeclampsia precautions.   Assessment: 1. Hypertension affecting pregnancy in third trimester   2. Labile blood pressure   3. Uterine fibroid during pregnancy, antepartum   4. Increased nuchal translucency space on fetal ultrasound     Plan: Discharge home Labor precautions and fetal kick counts Preeclampsia precautions and notification to return to MAU with any signs of ha, vision changes, or abdominal pain.  Blood pressure check in clinic tomorrow morning   Allergies as of 07/19/2017      Reactions   Erythromycin    Stomach cramps      Medication List    TAKE these medications   acetaminophen 500 MG tablet Commonly known as:  TYLENOL Take 1,000 mg by mouth every 6 (six) hours as needed for moderate pain.   EXCEDRIN MIGRAINE 250-250-65 MG tablet Generic drug:  aspirin-acetaminophen-caffeine Take 2 tablets by mouth every 6 (six) hours as needed for headache.   hydrOXYzine 25 MG tablet Commonly known as:  ATARAX/VISTARIL Take 1 tablet (25 mg total) by mouth every 6 (six) hours as needed for itching.   multivitamin-prenatal 27-0.8 MG Tabs tablet Take 1  tablet by mouth daily at 12 noon.       Darrol Poke Certified Nurse-Midwife 07/19/2017 5:19 PM

## 2017-07-19 NOTE — Progress Notes (Signed)
   PRENATAL VISIT NOTE  Subjective:  Courtney Farley is a 36 y.o. G3P2002 at [redacted]w[redacted]d being seen today for ongoing prenatal care.  She is currently monitored for the following issues for this high-risk pregnancy and has Supervision of other normal pregnancy, antepartum; Abnormal cervical Papanicolaou smear affecting pregnancy, antepartum; History of cervical LEEP biopsy affecting care of mother, antepartum; Maternal tobacco use, antepartum; Increased nuchal translucency space on fetal ultrasound; Uterine fibroid during pregnancy, antepartum; Abnormal fetal echocardiogram, affecting care of mother, antepartum; Uterine size date discrepancy, antepartum; and Itching on their problem list.  Patient reports headache and occasional contractions.  Contractions: Irritability. Vag. Bleeding: None.  Movement: Present. Denies leaking of fluid.   Bile Acids came back normal as did liver function tests.  Still itching  Headaches come and go.  Not like typical migraines  The following portions of the patient's history were reviewed and updated as appropriate: allergies, current medications, past family history, past medical history, past social history, past surgical history and problem list. Problem list updated.  Objective:   Vitals:   07/19/17 0936 07/19/17 0954  BP: (!) 125/98 122/88  Pulse: (!) 106   Weight: 177 lb (80.3 kg)     Fetal Status:     Movement: Present     General:  Alert, oriented and cooperative. Patient is in no acute distress.  Skin: Skin is warm and dry. No rash noted.   Cardiovascular: Normal heart rate noted  Respiratory: Normal respiratory effort, no problems with respiration noted  Abdomen: Soft, gravid, appropriate for gestational age.  Pain/Pressure: Present     Pelvic: Cervical exam deferred        Extremities: Normal range of motion.  Edema: None  Mental Status:  Normal mood and affect. Normal behavior. Normal judgment and thought content.   Assessment and Plan:    Pregnancy: [id number] at [redacted]w[redacted]d Patient Active Problem List   Diagnosis Date Noted  . Labile blood pressure 07/19/2017  . Itching 07/12/2017  . Abnormal fetal echocardiogram, affecting care of mother, antepartum 06/02/2017  . Uterine size date discrepancy, antepartum 06/02/2017  . Uterine fibroid during pregnancy, antepartum 03/20/2017  . Increased nuchal translucency space on fetal ultrasound 02/04/2017  . Supervision of other normal pregnancy, antepartum 01/17/2017  . Abnormal cervical Papanicolaou smear affecting pregnancy, antepartum 01/17/2017  . History of cervical LEEP biopsy affecting care of mother, antepartum 01/17/2017  . Maternal tobacco use, antepartum 01/17/2017   Discussed elevated BP with repeat that was normal (122/88) Discussed gestational hypertension, risk of preeclampsia Offered reeval tomorrow or go to MAU now.  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  Would likely recommend IOL if has GHTN>   Will Rx Vistaril for itching   Warned of sleepy side effect, no driving  Term labor symptoms and general obstetric precautions including but not limited to vaginal bleeding, contractions, leaking of fluid and fetal movement were reviewed in detail with the patient. Please refer to After Visit Summary for other counseling recommendations.     Hansel Feinstein, CNM

## 2017-07-19 NOTE — Patient Instructions (Signed)

## 2017-07-19 NOTE — Progress Notes (Signed)
Repeat blood pressure 122/88 . Kathrene Alu RNBSN

## 2017-07-19 NOTE — MAU Note (Signed)
Elevated b/p at clinic today

## 2017-07-20 ENCOUNTER — Encounter (HOSPITAL_COMMUNITY): Payer: Self-pay | Admitting: *Deleted

## 2017-07-20 ENCOUNTER — Encounter (HOSPITAL_COMMUNITY): Payer: Self-pay | Admitting: Anesthesiology

## 2017-07-20 ENCOUNTER — Other Ambulatory Visit: Payer: Self-pay

## 2017-07-20 ENCOUNTER — Ambulatory Visit: Payer: Medicaid Other | Admitting: General Practice

## 2017-07-20 ENCOUNTER — Inpatient Hospital Stay (HOSPITAL_COMMUNITY)
Admission: AD | Admit: 2017-07-20 | Discharge: 2017-07-22 | DRG: 807 | Disposition: A | Discharge status: Home / Self Care | Payer: Medicaid Other | Source: Ambulatory Visit | Attending: Obstetrics and Gynecology | Admitting: Obstetrics and Gynecology

## 2017-07-20 VITALS — BP 126/98 | HR 128 | Ht 64.0 in | Wt 178.0 lb

## 2017-07-20 DIAGNOSIS — D259 Leiomyoma of uterus, unspecified: Secondary | ICD-10-CM | POA: Diagnosis present

## 2017-07-20 DIAGNOSIS — O134 Gestational [pregnancy-induced] hypertension without significant proteinuria, complicating childbirth: Principal | ICD-10-CM | POA: Diagnosis present

## 2017-07-20 DIAGNOSIS — O26893 Other specified pregnancy related conditions, third trimester: Secondary | ICD-10-CM | POA: Diagnosis present

## 2017-07-20 DIAGNOSIS — R0989 Other specified symptoms and signs involving the circulatory and respiratory systems: Secondary | ICD-10-CM

## 2017-07-20 DIAGNOSIS — O139 Gestational [pregnancy-induced] hypertension without significant proteinuria, unspecified trimester: Secondary | ICD-10-CM | POA: Diagnosis present

## 2017-07-20 DIAGNOSIS — O3413 Maternal care for benign tumor of corpus uteri, third trimester: Secondary | ICD-10-CM | POA: Diagnosis present

## 2017-07-20 DIAGNOSIS — O163 Unspecified maternal hypertension, third trimester: Secondary | ICD-10-CM | POA: Diagnosis not present

## 2017-07-20 DIAGNOSIS — Z87891 Personal history of nicotine dependence: Secondary | ICD-10-CM | POA: Diagnosis not present

## 2017-07-20 DIAGNOSIS — Z3A37 37 weeks gestation of pregnancy: Secondary | ICD-10-CM | POA: Diagnosis not present

## 2017-07-20 DIAGNOSIS — O133 Gestational [pregnancy-induced] hypertension without significant proteinuria, third trimester: Secondary | ICD-10-CM

## 2017-07-20 DIAGNOSIS — O3410 Maternal care for benign tumor of corpus uteri, unspecified trimester: Secondary | ICD-10-CM

## 2017-07-20 DIAGNOSIS — O26843 Uterine size-date discrepancy, third trimester: Secondary | ICD-10-CM | POA: Diagnosis present

## 2017-07-20 DIAGNOSIS — O283 Abnormal ultrasonic finding on antenatal screening of mother: Secondary | ICD-10-CM

## 2017-07-20 LAB — TYPE AND SCREEN
ABO/RH(D): O POS
Antibody Screen: NEGATIVE

## 2017-07-20 LAB — PROTEIN / CREATININE RATIO, URINE
Creatinine, Urine: 211 mg/dL
Protein Creatinine Ratio: 0.06 mg/mg{Cre} (ref 0.00–0.15)
Total Protein, Urine: 12 mg/dL

## 2017-07-20 LAB — COMPREHENSIVE METABOLIC PANEL
ALBUMIN: 2.8 g/dL — AB (ref 3.5–5.0)
ALK PHOS: 108 U/L (ref 38–126)
ALT: 18 U/L (ref 14–54)
AST: 28 U/L (ref 15–41)
Anion gap: 8 (ref 5–15)
BUN: 9 mg/dL (ref 6–20)
CALCIUM: 8.9 mg/dL (ref 8.9–10.3)
CHLORIDE: 104 mmol/L (ref 101–111)
CO2: 21 mmol/L — AB (ref 22–32)
CREATININE: 0.59 mg/dL (ref 0.44–1.00)
GFR calc Af Amer: >60 mL/min (ref >60)
GFR calc non Af Amer: >60 mL/min (ref >60)
GLUCOSE: 97 mg/dL (ref 65–99)
Potassium: 4.4 mmol/L (ref 3.5–5.1)
SODIUM: 133 mmol/L — AB (ref 135–145)
Total Bilirubin: 0.9 mg/dL (ref 0.3–1.2)
Total Protein: 6.5 g/dL (ref 6.5–8.1)

## 2017-07-20 LAB — ABO/RH: ABO/RH(D): O POS

## 2017-07-20 LAB — CBC
HEMATOCRIT: 37.5 % (ref 36.0–46.0)
HEMOGLOBIN: 12.2 g/dL (ref 12.0–15.0)
MCH: 27.3 pg (ref 26.0–34.0)
MCHC: 32.5 g/dL (ref 30.0–36.0)
MCV: 83.9 fL (ref 78.0–100.0)
PLATELETS: 312 10*3/uL (ref 150–400)
RBC: 4.47 MIL/uL (ref 3.87–5.11)
RDW: 13.6 % (ref 11.5–15.5)
WBC: 8.6 10*3/uL (ref 4.0–10.5)

## 2017-07-20 MED ORDER — OXYTOCIN 40 UNITS IN LACTATED RINGERS INFUSION - SIMPLE MED: 2.5000 [IU]/h | INTRAVENOUS | Status: DC

## 2017-07-20 MED ORDER — LACTATED RINGERS IV SOLN: 500.0000 mL | INTRAVENOUS | Status: DC | PRN

## 2017-07-20 MED ORDER — MISOPROSTOL 25 MCG QUARTER TABLET
25.0000 ug | ORAL_TABLET | ORAL | Status: DC | PRN
  Administered 2017-07-20 (×2): 25 ug via VAGINAL
  Filled 2017-07-20 (×3): qty 1

## 2017-07-20 MED ORDER — OXYTOCIN 40 UNITS IN LACTATED RINGERS INFUSION - SIMPLE MED
1.0000 m[IU]/min | INTRAVENOUS | Status: DC
  Administered 2017-07-20: 1 m[IU]/min via INTRAVENOUS
  Filled 2017-07-20: qty 1000

## 2017-07-20 MED ORDER — OXYCODONE-ACETAMINOPHEN 5-325 MG PO TABS: 2.0000 | ORAL_TABLET | ORAL | Status: DC | PRN

## 2017-07-20 MED ORDER — SOD CITRATE-CITRIC ACID 500-334 MG/5ML PO SOLN: 30.0000 mL | ORAL | Status: DC | PRN

## 2017-07-20 MED ORDER — LACTATED RINGERS IV SOLN
INTRAVENOUS | Status: DC
  Administered 2017-07-20 – 2017-07-21 (×5): via INTRAVENOUS

## 2017-07-20 MED ORDER — OXYTOCIN BOLUS FROM INFUSION: 500.0000 mL | Freq: Once | INTRAVENOUS | Status: DC

## 2017-07-20 MED ORDER — ACETAMINOPHEN 325 MG PO TABS: 650.0000 mg | ORAL_TABLET | ORAL | Status: DC | PRN

## 2017-07-20 MED ORDER — TERBUTALINE SULFATE 1 MG/ML IJ SOLN
0.2500 mg | Freq: Once | INTRAMUSCULAR | Status: DC | PRN
  Filled 2017-07-20: qty 1

## 2017-07-20 MED ORDER — LIDOCAINE HCL (PF) 1 % IJ SOLN
30.0000 mL | INTRAMUSCULAR | Status: DC | PRN
  Filled 2017-07-20: qty 30

## 2017-07-20 MED ORDER — OXYCODONE-ACETAMINOPHEN 5-325 MG PO TABS
1.0000 | ORAL_TABLET | ORAL | Status: DC | PRN
Start: 2017-07-20 — End: 2017-07-21

## 2017-07-20 MED ORDER — ONDANSETRON HCL 4 MG/2ML IJ SOLN: 4.0000 mg | Freq: Four times a day (QID) | INTRAMUSCULAR | Status: DC | PRN

## 2017-07-20 MED ORDER — OXYCODONE HCL 5 MG PO TABS: 5.0000 mg | ORAL_TABLET | Freq: Once | ORAL | Status: DC

## 2017-07-20 NOTE — Progress Notes (Signed)
LABOR PROGRESS NOTE  Courtney Farley is a 36 y.o. G3P2002 at [redacted]w[redacted]d  admitted for IOl for GHTN  Subjective: Patient doing well, only feeling mild cramping.   Objective: BP 123/90 (BP Location: Left Arm)   Pulse (!) 113   Temp 98.5 F (36.9 C) (Oral)   Resp 18   Ht 5\' 4"  (1.626 m)   Wt 178 lb (80.7 kg)   LMP 11/02/2016   BMI 30.55 kg/m  or  Vitals:   07/20/17 1409 07/20/17 1549 07/20/17 1758 07/20/17 1905  BP: (!) 143/86 130/81 128/83 123/90  Pulse: (!) 106 (!) 122 (!) 112 (!) 113  Resp:   17 18  Temp:   98.5 F (36.9 C)   TempSrc:   Oral   Weight:      Height:        SVE: Dilation: 3 Effacement (%): 30 Station: -3 Presentation: Vertex Exam by:: Dr. Lambert Keto FHT: baseline rate 140, moderate varibility, +acel, no decel Toco: irregular ctx  Assessment / Plan: 36 y.o. G3P2002 at [redacted]w[redacted]d here for IOL for GHTN. No signs/sx of PreE  Labor: Cervical ripening; cytotec #2 place Fetal Wellbeing:  Cat I Pain Control:  Per patient's request Anticipated MOD:  SVD  Gailen Shelter, MD 07/20/2017, 7:09 PM

## 2017-07-20 NOTE — Progress Notes (Signed)
SVE 8-1KG/81%/-8, cephalic Cytotec placed.  FHT Cat I  Jenne Pane. [place], MD OB Fellow

## 2017-07-20 NOTE — Addendum Note (Signed)
Addended by: Shelly Coss on: 07/20/2017 01:32 PM   Modules accepted: Level of Service

## 2017-07-20 NOTE — Progress Notes (Signed)
Patient here for BP check today. Patient denies blurry vision or dizziness but has had headaches. Patient reports a history of headaches and this isn't a new symptom for her.   Reviewed patient's blood pressures with Manya Silvas who recommends direct admit for IOL.   Patient informed of plan of care & taken upstairs for admission.

## 2017-07-20 NOTE — MAU Note (Signed)
Pt sent up from clinic for induction due to elevated b/p's

## 2017-07-20 NOTE — Progress Notes (Signed)
LABOR PROGRESS NOTE  Courtney Farley is a 36 y.o. G3P2002 at [redacted]w[redacted]d  admitted for IOL for gHTN.   Subjective: Patient is ambulating around the room, but comfortable at the moment. Received Vistaril for pruritus which seem to help.  Objective: BP 123/90 (BP Location: Left Arm)   Pulse (!) 113   Temp 98.5 F (36.9 C) (Oral)   Resp 18   Ht 5\' 4"  (1.626 m)   Wt 178 lb (80.7 kg)   LMP 11/02/2016   BMI 30.55 kg/m  or  Vitals:   07/20/17 1409 07/20/17 1549 07/20/17 1758 07/20/17 1905  BP: (!) 143/86 130/81 128/83 123/90  Pulse: (!) 106 (!) 122 (!) 112 (!) 113  Resp:   17 18  Temp:   98.5 F (36.9 C)   TempSrc:   Oral   Weight:      Height:        Last SVE: 1900 Dilation: 3 Effacement (%): 30 Station: -3 Presentation: Vertex Exam by:: Dr. Lambert Keto  FHT: HR 150, moderate variability, +accels, no decels Toco:  Labs: Lab Results  Component Value Date   WBC 8.6 07/20/2017   HGB 12.2 07/20/2017   HCT 37.5 07/20/2017   MCV 83.9 07/20/2017   PLT 312 07/20/2017    Patient Active Problem List   Diagnosis Date Noted  . Gestational hypertension 07/20/2017  . Labile blood pressure 07/19/2017  . Itching 07/12/2017  . Abnormal fetal echocardiogram, affecting care of mother, antepartum 06/02/2017  . Uterine size date discrepancy, antepartum 06/02/2017  . Uterine fibroid during pregnancy, antepartum 03/20/2017  . Increased nuchal translucency space on fetal ultrasound 02/04/2017  . Supervision of other normal pregnancy, antepartum 01/17/2017  . Abnormal cervical Papanicolaou smear affecting pregnancy, antepartum 01/17/2017  . History of cervical LEEP biopsy affecting care of mother, antepartum 01/17/2017  . Maternal tobacco use, antepartum 01/17/2017    Assessment / Plan: 36 y.o. G3P2002 at [redacted]w[redacted]d here for IOL for g HTN.   Labor: Cytotec x2 Fetal Wellbeing: Cat 1  Pain Control:  Per patient request Anticipated MOD:  SVD  Marjie Skiff, MD 07/20/2017, 9:13 PM

## 2017-07-20 NOTE — Progress Notes (Signed)
Here for BP check. Goes to [facility]. BP elevated yesterday in the office. Went to MAU for Pre-E eval. BP's still elevated. Labs Nml. Has been having HA's that do not resolve w/ Excedrin, but has Hx migraines.   BP (!) 126/98   Pulse (!) 128   Ht 5\' 4"  (1.626 m)   Wt 178 lb (80.7 kg)   LMP 11/02/2016   BMI 30.55 kg/m   Consulted w/ Dr. Ilda Basset who recommends IOL for GHTN vs Pre-E (HA).  Will let Dr. Lambert Keto decide if pt needs Mag Sulfate.  L&D full. Pt escorted to MAU by RN to await bed and start admission.  Tamala Julian, [place], [place] 07/20/2017 12:48 PM

## 2017-07-20 NOTE — Anesthesia Pain Management Evaluation Note (Signed)
  CRNA Pain Management Visit Note  Patient: Courtney Farley, 36 y.o., female  "Hello I am a member of the anesthesia team at [facility]. We have an anesthesia team available at all times to provide care throughout the hospital, including epidural management and anesthesia for C-section. I don't know your plan for the delivery whether it a natural birth, water birth, IV sedation, nitrous supplementation, doula or epidural, but we want to meet your pain goals."   1.Was your pain managed to your expectations on prior hospitalizations?   Yes   2.What is your expectation for pain management during this hospitalization?     Labor support without medications  3.How can we help you reach that goal? natural  Record the patient's initial score and the patient's pain goal.   Pain: 0  Pain Goal: 10 [facility] wants you to be able to say your pain was always managed very well.  Jenne Sellinger 07/20/2017

## 2017-07-20 NOTE — H&P (Signed)
OBSTETRIC ADMISSION HISTORY AND PHYSICAL  Courtney Farley is a 36 y.o. female G3P2002 with IUP at [redacted]w[redacted]d by LMP and early [place] presenting for IOL for Gestational HTN. She reports +FMs, No LOF, no VB, no blurry vision, no peripheral edema, or RUQ pain. Reports temporal HA, has not taken anything today.  Dating: By LMP --->  Estimated Date of Delivery: 08/09/17  Sono:    @[redacted]w[redacted]d , CWD, normal anatomy, vtx presentation, 2918g >6'7, 82% EFW, AFI 17cm.  Prenatal History/Complications: -Abnormal pap >ASCUS cannot exclude high grade lesion, s/p colpo -no lesion detected -Hx LEEP 2011, 2016 -Increased nuchal translucency, genetic counseling, normal NIPS -S>D-uterine fibroids -Abnormal fetal echo >small to moderate perimembranous vs supracristal ventricular septal defect. Peds cards: okay to deliver at [facility] -Previous tobacco use, stopped during pregnancy  Past Medical History: Past Medical History:  Diagnosis Date  . HPV in female   . Migraine     Past Surgical History: Past Surgical History:  Procedure Laterality Date  . GANGLION CYST EXCISION    . LEEP      Obstetrical History: OB History    Gravida Para Term Preterm AB Living   3 2 2     2    SAB TAB Ectopic Multiple Live Births                  Social History: Social History   Socioeconomic History  . Marital status: Single    Spouse name: Not on file  . Number of children: Not on file  . Years of education: Not on file  . Highest education level: Not on file  Social Needs  . Financial resource strain: Not on file  . Food insecurity - worry: Not on file  . Food insecurity - inability: Not on file  . Transportation needs - medical: Not on file  . Transportation needs - non-medical: Not on file  Occupational History  . Not on file  Tobacco Use  . Smoking status: Former Smoker    Years: 1.00    Types: Cigars  . Smokeless tobacco: Never Used  Substance and Sexual Activity  . Alcohol use: No  . Drug use: No  .  Sexual activity: Yes    Birth control/protection: None  Other Topics Concern  . Not on file  Social History Narrative  . Not on file    Family History: Family History  Problem Relation Age of Onset  . Hypertension Mother   . Hypertension Father   . Congenital adrenal hyperplasia Other   . Congenital adrenal hyperplasia Other     Allergies: Allergies  Allergen Reactions  . Erythromycin     Stomach cramps    Medications Prior to Admission  Medication Sig Dispense Refill Last Dose  . acetaminophen (TYLENOL) 500 MG tablet Take 1,000 mg by mouth every 6 (six) hours as needed for moderate pain.   Past Month at Unknown time  . aspirin-acetaminophen-caffeine (EXCEDRIN MIGRAINE) 250-250-65 MG tablet Take 2 tablets by mouth every 6 (six) hours as needed for headache.   Past Week at Unknown time  . hydrOXYzine (ATARAX/VISTARIL) 25 MG tablet Take 1 tablet (25 mg total) by mouth every 6 (six) hours as needed for itching. (Patient not taking: Reported on 07/19/2017) 30 tablet 2 Not Taking at Unknown time  . Prenatal Vit-Fe Fumarate-FA (MULTIVITAMIN-PRENATAL) 27-0.8 MG TABS tablet Take 1 tablet by mouth daily at 12 noon. (Patient not taking: Reported on 07/19/2017) 30 each 10 Not Taking at Unknown time     Review  of Systems   All systems reviewed and negative except as stated in HPI  Last menstrual period 11/02/2016, unknown if currently breastfeeding. General appearance: alert, cooperative and no distress Lungs: clear to auscultation bilaterally Heart: regular rate and rhythm Abdomen: soft, non-tender; bowel sounds normal Pelvic: deferred Extremities: Homans sign is negative, no sign of DVT Presentation: unsure Fetal monitoringBaseline: 140 bpm, Variability: Good {> 6 bpm), Accelerations: Reactive and Decelerations: Absent Uterine activityNone     Prenatal labs: ABO, Rh: O/Positive/-- (05/21 1134) Antibody: Negative (05/21 1134) Rubella: 9.21 (05/21 1134) RPR: Non Reactive  (09/06 1002)  HBsAg: Negative (05/21 1134)  HIV:  NR  GBS:   21 hr Glucola nml Genetic screening  nml Anatomy US nml  Prenatal Transfer Tool  Maternal Diabetes: No Genetic Screening: Normal Maternal Ultrasounds/Referrals: Abnormal:  Findings:   Other: Fetal Ultrasounds or other Referrals:  Fetal echo, Referred to Materal Fetal Medicine  Maternal Substance Abuse:  No Significant Maternal Medications:  None Significant Maternal Lab Results: None  Results for orders placed or performed during the hospital encounter of 07/19/17 (from the past 24 hour(s))  Urinalysis, Routine w reflex microscopic   Collection Time: 07/19/17  4:40 PM  Result Value Ref Range   Color, Urine YELLOW YELLOW   APPearance CLOUDY (A) CLEAR   Specific Gravity, Urine 1.021 1.005 - 1.030   pH 5.0 5.0 - 8.0   Glucose, UA 50 (A) NEGATIVE mg/dL   Hgb urine dipstick NEGATIVE NEGATIVE   Bilirubin Urine NEGATIVE NEGATIVE   Ketones, ur NEGATIVE NEGATIVE mg/dL   Protein, ur NEGATIVE NEGATIVE mg/dL   Nitrite NEGATIVE NEGATIVE   Leukocytes, UA NEGATIVE NEGATIVE  Protein / creatinine ratio, urine   Collection Time: 07/19/17  4:40 PM  Result Value Ref Range   Creatinine, Urine 194.00 mg/dL   Total Protein, Urine 14 mg/dL   Protein Creatinine Ratio 0.07 0.00 - 0.15 mg/mg[Cre]  CBC   Collection Time: 07/19/17  5:54 PM  Result Value Ref Range   WBC 8.1 4.0 - 10.5 K/uL   RBC 4.29 3.87 - 5.11 MIL/uL   Hemoglobin 11.9 (L) 12.0 - 15.0 g/dL   HCT 36.1 36.0 - 46.0 %   MCV 84.1 78.0 - 100.0 fL   MCH 27.7 26.0 - 34.0 pg   MCHC 33.0 30.0 - 36.0 g/dL   RDW 13.6 11.5 - 15.5 %   Platelets 313 150 - 400 K/uL  Comprehensive metabolic panel   Collection Time: 07/19/17  5:54 PM  Result Value Ref Range   Sodium 134 (L) 135 - 145 mmol/L   Potassium 3.7 3.5 - 5.1 mmol/L   Chloride 104 101 - 111 mmol/L   CO2 21 (L) 22 - 32 mmol/L   Glucose, Bld 131 (H) 65 - 99 mg/dL   BUN 10 6 - 20 mg/dL   Creatinine, Ser 0.65 0.44 - 1.00  mg/dL   Calcium 8.8 (L) 8.9 - 10.3 mg/dL   Total Protein 6.8 6.5 - 8.1 g/dL   Albumin 2.9 (L) 3.5 - 5.0 g/dL   AST 25 15 - 41 U/L   ALT 19 14 - 54 U/L   Alkaline Phosphatase 104 38 - 126 U/L   Total Bilirubin 0.2 (L) 0.3 - 1.2 mg/dL   GFR calc non Af Amer >60 >60 mL/min   GFR calc Af Amer >60 >60 mL/min   Anion gap 9 5 - 15    Patient Active Problem List   Diagnosis Date Noted  . Labile blood pressure 07/19/2017  .  Itching 07/12/2017  . Abnormal fetal echocardiogram, affecting care of mother, antepartum 06/02/2017  . Uterine size date discrepancy, antepartum 06/02/2017  . Uterine fibroid during pregnancy, antepartum 03/20/2017  . Increased nuchal translucency space on fetal ultrasound 02/04/2017  . Supervision of other normal pregnancy, antepartum 01/17/2017  . Abnormal cervical Papanicolaou smear affecting pregnancy, antepartum 01/17/2017  . History of cervical LEEP biopsy affecting care of mother, antepartum 01/17/2017  . Maternal tobacco use, antepartum 01/17/2017    Assessment: Courtney Farley is a 36 y.o. G3P2002 at [redacted]w[redacted]d here for IOL for Gestational HTN  1. Labor: IOL 2. FWB: Cat I 3. Pain: analgesia/anesthesia prn 4. GBS: neg   Plan: 1. Admit to BS 2. Cytotec/foley for ripening 3. Pre-e labs  4. Anticipate SVD  Julianne Handler, CNM  07/20/2017, 12:48 PM

## 2017-07-21 ENCOUNTER — Encounter (HOSPITAL_COMMUNITY): Payer: Self-pay | Admitting: Anesthesiology

## 2017-07-21 ENCOUNTER — Inpatient Hospital Stay (HOSPITAL_COMMUNITY): Payer: Medicaid Other | Admitting: Anesthesiology

## 2017-07-21 DIAGNOSIS — Z3A37 37 weeks gestation of pregnancy: Secondary | ICD-10-CM

## 2017-07-21 DIAGNOSIS — O134 Gestational [pregnancy-induced] hypertension without significant proteinuria, complicating childbirth: Secondary | ICD-10-CM

## 2017-07-21 LAB — RPR: RPR: NONREACTIVE

## 2017-07-21 MED ORDER — FENTANYL CITRATE (PF) 100 MCG/2ML IJ SOLN
100.0000 ug | INTRAMUSCULAR | Status: DC | PRN
  Administered 2017-07-21: 100 ug via INTRAVENOUS
  Filled 2017-07-21: qty 2

## 2017-07-21 MED ORDER — TETANUS-DIPHTH-ACELL PERTUSSIS 5-2.5-18.5 LF-MCG/0.5 IM SUSP: 0.5000 mL | Freq: Once | INTRAMUSCULAR | Status: DC

## 2017-07-21 MED ORDER — ZOLPIDEM TARTRATE 5 MG PO TABS: 5.0000 mg | ORAL_TABLET | Freq: Every evening | ORAL | Status: DC | PRN

## 2017-07-21 MED ORDER — PHENYLEPHRINE 40 MCG/ML (10ML) SYRINGE FOR IV PUSH (FOR BLOOD PRESSURE SUPPORT)
80.0000 ug | PREFILLED_SYRINGE | INTRAVENOUS | Status: DC | PRN
  Filled 2017-07-21: qty 5

## 2017-07-21 MED ORDER — ACETAMINOPHEN 325 MG PO TABS: 650.0000 mg | ORAL_TABLET | ORAL | Status: DC | PRN

## 2017-07-21 MED ORDER — DIPHENHYDRAMINE HCL 50 MG/ML IJ SOLN: 12.5000 mg | INTRAMUSCULAR | Status: DC | PRN

## 2017-07-21 MED ORDER — PHENYLEPHRINE 40 MCG/ML (10ML) SYRINGE FOR IV PUSH (FOR BLOOD PRESSURE SUPPORT)
80.0000 ug | PREFILLED_SYRINGE | INTRAVENOUS | Status: DC | PRN
  Filled 2017-07-21: qty 10
  Filled 2017-07-21: qty 5

## 2017-07-21 MED ORDER — SODIUM BICARBONATE 8.4 % IV SOLN
INTRAVENOUS | Status: DC | PRN
  Administered 2017-07-21: 7 mL via EPIDURAL

## 2017-07-21 MED ORDER — PRENATAL MULTIVITAMIN CH
1.0000 | ORAL_TABLET | Freq: Every day | ORAL | Status: DC
  Filled 2017-07-21: qty 1

## 2017-07-21 MED ORDER — LIDOCAINE HCL (PF) 1 % IJ SOLN
INTRAMUSCULAR | Status: DC | PRN
  Administered 2017-07-21: 5 mL via EPIDURAL
  Administered 2017-07-21: 6 mL via EPIDURAL

## 2017-07-21 MED ORDER — BENZOCAINE-MENTHOL 20-0.5 % EX AERO: 1.0000 "application " | INHALATION_SPRAY | CUTANEOUS | Status: DC | PRN

## 2017-07-21 MED ORDER — DIBUCAINE 1 % RE OINT: 1.0000 "application " | TOPICAL_OINTMENT | RECTAL | Status: DC | PRN

## 2017-07-21 MED ORDER — BUPIVACAINE HCL (PF) 0.25 % IJ SOLN
INTRAMUSCULAR | Status: DC | PRN
  Administered 2017-07-21: 7 mL via EPIDURAL

## 2017-07-21 MED ORDER — EPHEDRINE 5 MG/ML INJ
10.0000 mg | INTRAVENOUS | Status: DC | PRN
  Filled 2017-07-21: qty 2

## 2017-07-21 MED ORDER — OXYTOCIN 40 UNITS IN LACTATED RINGERS INFUSION - SIMPLE MED
1.0000 m[IU]/min | INTRAVENOUS | Status: DC
Start: 2017-07-21 — End: 2017-07-21

## 2017-07-21 MED ORDER — FENTANYL 2.5 MCG/ML BUPIVACAINE 1/10 % EPIDURAL INFUSION (WH - ANES)
14.0000 mL/h | INTRAMUSCULAR | Status: DC | PRN
  Administered 2017-07-21: 14 mL/h via EPIDURAL
  Filled 2017-07-21: qty 100

## 2017-07-21 MED ORDER — WITCH HAZEL-GLYCERIN EX PADS: 1.0000 "application " | MEDICATED_PAD | CUTANEOUS | Status: DC | PRN

## 2017-07-21 MED ORDER — ONDANSETRON HCL 4 MG PO TABS
4.0000 mg | ORAL_TABLET | ORAL | Status: DC | PRN
Start: 2017-07-21 — End: 2017-07-22

## 2017-07-21 MED ORDER — DIPHENHYDRAMINE HCL 25 MG PO CAPS: 25.0000 mg | ORAL_CAPSULE | Freq: Four times a day (QID) | ORAL | Status: DC | PRN

## 2017-07-21 MED ORDER — IBUPROFEN 600 MG PO TABS
600.0000 mg | ORAL_TABLET | Freq: Four times a day (QID) | ORAL | Status: DC
  Filled 2017-07-21: qty 1

## 2017-07-21 MED ORDER — MISOPROSTOL 200 MCG PO TABS
ORAL_TABLET | ORAL | Status: AC
Start: 2017-07-21 — End: 2017-07-21
  Administered 2017-07-21: 1000 ug via RECTAL
  Filled 2017-07-21: qty 5

## 2017-07-21 MED ORDER — SIMETHICONE 80 MG PO CHEW: 80.0000 mg | CHEWABLE_TABLET | ORAL | Status: DC | PRN

## 2017-07-21 MED ORDER — ONDANSETRON HCL 4 MG/2ML IJ SOLN: 4.0000 mg | INTRAMUSCULAR | Status: DC | PRN

## 2017-07-21 MED ORDER — COCONUT OIL OIL: 1.0000 "application " | TOPICAL_OIL | Status: DC | PRN

## 2017-07-21 MED ORDER — LACTATED RINGERS IV SOLN: 500.0000 mL | Freq: Once | INTRAVENOUS | Status: DC

## 2017-07-21 MED ORDER — SENNOSIDES-DOCUSATE SODIUM 8.6-50 MG PO TABS: 2.0000 | ORAL_TABLET | ORAL | Status: DC

## 2017-07-21 NOTE — Progress Notes (Signed)
LABOR PROGRESS NOTE  Marit Goodwill is a 36 y.o. G3P2002 at [redacted]w[redacted]d  admitted for IOl for GHTN  Subjective: Patient feeling strong contractions. Has tried Nitrous oxide for pain. Does not want epidural  Objective: BP 136/88   Pulse 98   Temp 98 F (36.7 C) (Oral)   Resp 18   Ht 5\' 4"  (1.626 m)   Wt 178 lb (80.7 kg)   LMP 11/02/2016   SpO2 100%   BMI 30.55 kg/m  or  Vitals:   07/21/17 1031 07/21/17 1131 07/21/17 1201 07/21/17 1258  BP: 114/67 124/81 (!) 121/92 136/88  Pulse: 93 (!) 103 100 98  Resp: 16 18 18 18   Temp:      TempSrc:      SpO2:      Weight:      Height:        SVE: Dilation: 4.5 Effacement (%): 70, 80 Cervical Position: Posterior Station: -3 Presentation: Vertex Exam by:: Dr Lambert Keto FHT: baseline rate 125, moderate varibility, +acel, occasional variable decel Toco: ctx q2-4 min  Assessment / Plan: 36 y.o. G3P2002 at [redacted]w[redacted]d here for IOL for GHTN. No signs/sx of PreE  Labor: On IV Pit. Cervix seems to have some scar tissues (s/p LEEP x2), which I have manipulated. Continue IV Pit. Will plan to recheck in about 2 hours.  Fetal Wellbeing:  Cat II  Pain Control:  Per patient's request Anticipated MOD:  SVD  Gailen Shelter, MD 07/21/2017, 1:27 PM

## 2017-07-21 NOTE — Progress Notes (Signed)
Reported platelet level to Dr. Jillyn Hidden, Dr. Jillyn Hidden reported that no further blood work was needed prior to epidural placement.

## 2017-07-21 NOTE — Anesthesia Preprocedure Evaluation (Signed)
Anesthesia Evaluation  Patient identified by MRN, date of birth, ID band Patient awake    Reviewed: Allergy & Precautions, H&P , NPO status , Patient's Chart, lab work & pertinent test results  Airway Mallampati: I  TM Distance: >3 FB Neck ROM: full    Dental no notable dental hx. (+) Teeth Intact   Pulmonary neg pulmonary ROS, former smoker,    Pulmonary exam normal breath sounds clear to auscultation       Cardiovascular Normal cardiovascular exam Rhythm:regular Rate:Normal     Neuro/Psych negative psych ROS   GI/Hepatic negative GI ROS, Neg liver ROS,   Endo/Other  negative endocrine ROS  Renal/GU negative Renal ROS  negative genitourinary   Musculoskeletal negative musculoskeletal ROS (+)   Abdominal Normal abdominal exam  (+)   Peds  Hematology negative hematology ROS (+)   Anesthesia Other Findings   Reproductive/Obstetrics (+) Pregnancy                             Anesthesia Physical Anesthesia Plan  ASA: II  Anesthesia Plan: Epidural   Post-op Pain Management:    Induction:   PONV Risk Score and Plan:   Airway Management Planned:   Additional Equipment:   Intra-op Plan:   Post-operative Plan:   Informed Consent: I have reviewed the patients History and Physical, chart, labs and discussed the procedure including the risks, benefits and alternatives for the proposed anesthesia with the patient or authorized representative who has indicated his/her understanding and acceptance.     Plan Discussed with:   Anesthesia Plan Comments:         Anesthesia Quick Evaluation

## 2017-07-21 NOTE — Anesthesia Procedure Notes (Signed)
Epidural Patient location during procedure: OB Start time: 07/21/2017 2:06 PM End time: 07/21/2017 2:09 PM  Staffing Anesthesiologist: Lyn Hollingshead, MD Performed: anesthesiologist   Preanesthetic Checklist Completed: patient identified, site marked, surgical consent, pre-op evaluation, timeout performed, IV checked, risks and benefits discussed and monitors and equipment checked  Epidural Patient position: sitting Prep: site prepped and draped and DuraPrep Patient monitoring: continuous pulse ox and blood pressure Approach: midline Location: L3-L4 Injection technique: LOR air  Needle:  Needle type: Tuohy  Needle gauge: 17 G Needle length: 9 cm and 9 Needle insertion depth: 6 cm Catheter type: closed end flexible Catheter size: 19 Gauge Catheter at skin depth: 11 cm Test dose: negative and Other  Assessment Sensory level: T9 Events: blood not aspirated, injection not painful, no injection resistance, negative IV test and no paresthesia  Additional Notes Reason for block:procedure for pain

## 2017-07-21 NOTE — Progress Notes (Signed)
LABOR PROGRESS NOTE  Courtney Farley is a 36 y.o. G3P2002 at [redacted]w[redacted]d  admitted for IOL for gHTN.   Subjective: Patient is still comfortable and will be started on nitrous oxide.  Objective: BP 139/86   Pulse (!) 108   Temp 99 F (37.2 C) (Oral)   Resp 20   Ht 5\' 4"  (1.626 m)   Wt 178 lb (80.7 kg)   LMP 11/02/2016   BMI 30.55 kg/m  or  Vitals:   07/20/17 2247 07/20/17 2342 07/21/17 0003 07/21/17 0031  BP: 120/89 (!) 131/95 121/83 139/86  Pulse: (!) 101 (!) 101 (!) 106 (!) 108  Resp: 20     Temp: 99 F (37.2 C)     TempSrc: Oral     Weight:      Height:        Last SVE: 2316 Dilation: 3.5 Effacement (%): 80 Station: -1 Presentation: Vertex Exam by:: Hansel Feinstein, CNM  FHT: HR 130, moderate variability, +accels, no decels Toco: q2-3 min   Labs: Lab Results  Component Value Date   WBC 8.6 07/20/2017   HGB 12.2 07/20/2017   HCT 37.5 07/20/2017   MCV 83.9 07/20/2017   PLT 312 07/20/2017    Patient Active Problem List   Diagnosis Date Noted  . Gestational hypertension 07/20/2017  . Labile blood pressure 07/19/2017  . Itching 07/12/2017  . Abnormal fetal echocardiogram, affecting care of mother, antepartum 06/02/2017  . Uterine size date discrepancy, antepartum 06/02/2017  . Uterine fibroid during pregnancy, antepartum 03/20/2017  . Increased nuchal translucency space on fetal ultrasound 02/04/2017  . Supervision of other normal pregnancy, antepartum 01/17/2017  . Abnormal cervical Papanicolaou smear affecting pregnancy, antepartum 01/17/2017  . History of cervical LEEP biopsy affecting care of mother, antepartum 01/17/2017  . Maternal tobacco use, antepartum 01/17/2017    Assessment / Plan: 36 y.o. G3P2002 at [redacted]w[redacted]d here for IOL for gHTN.   Labor: Cytotec x2 Fetal Wellbeing: Cat 1  Pain Control:   Nitrous oxide Anticipated MOD:  SVD  Marjie Skiff, MD 07/21/2017, 1:05 AM

## 2017-07-22 NOTE — Discharge Summary (Signed)
OB Discharge Summary     Patient Name: Courtney Farley DOB: March 02, 1981 MRN: [id number]  Date of admission: 07/20/2017 Delivering MD: Gailen Shelter   Date of discharge: 07/22/2017  Admitting diagnosis: INDUCTION Intrauterine pregnancy: [redacted]w[redacted]d     Secondary diagnosis:  Active Problems:   Gestational hypertension  Additional problems:      Discharge diagnosis: Term Pregnancy Delivered                                                                                                Post partum procedures:n/a  Augmentation: Pitocin and Cytotec  Complications: None  Hospital course:  Onset of Labor With Vaginal Delivery     36 y.o. yo G3P2002 at [redacted]w[redacted]d was admitted in Latent Labor on 07/20/2017. Patient had an uncomplicated labor course as follows:  Membrane Rupture Time/Date: 1:03 PM ,07/21/2017   Intrapartum Procedures: Episiotomy: None [1]                                         Lacerations:  None [1]  Patient had a delivery of a Viable infant. 07/21/2017  Information for the patient's newborn:  Lennon, Richins [[id number]]  Delivery Method: Vaginal, Spontaneous(Filed from Delivery Summary)    Pateint had an uncomplicated postpartum course.  She is ambulating, tolerating a regular diet, passing flatus, and urinating well. Patient is discharged home in stable condition on 07/22/17.   Physical exam  Vitals:   07/21/17 2130 07/21/17 2230 07/22/17 0222 07/22/17 0859  BP: 138/88 138/83 (!) 127/57 110/62  Pulse: 94 95 (!) 102 98  Resp: 16 16 16 19   Temp: 98 F (36.7 C) 98 F (36.7 C) 98.5 F (36.9 C) 99 F (37.2 C)  TempSrc: Oral Oral Oral Oral  SpO2: 100%     Weight:      Height:       General: alert Lochia: appropriate Uterine Fundus: firm Incision: N/A DVT Evaluation: No evidence of DVT seen on physical exam. Labs: Lab Results  Component Value Date   WBC 8.6 07/20/2017   HGB 12.2 07/20/2017   HCT 37.5 07/20/2017   MCV 83.9 07/20/2017   PLT 312 07/20/2017    CMP Latest Ref Rng & Units 07/20/2017  Glucose 65 - 99 mg/dL 97  BUN 6 - 20 mg/dL 9  Creatinine 0.44 - 1.00 mg/dL 0.59  Sodium 135 - 145 mmol/L 133(L)  Potassium 3.5 - 5.1 mmol/L 4.4  Chloride 101 - 111 mmol/L 104  CO2 22 - 32 mmol/L 21(L)  Calcium 8.9 - 10.3 mg/dL 8.9  Total Protein 6.5 - 8.1 g/dL 6.5  Total Bilirubin 0.3 - 1.2 mg/dL 0.9  Alkaline Phos 38 - 126 U/L 108  AST 15 - 41 U/L 28  ALT 14 - 54 U/L 18    Discharge instruction: per After Visit Summary and "Baby and Me Booklet".  After visit meds:  Allergies as of 07/22/2017      Reactions   Erythromycin    Stomach cramps  Medication List    STOP taking these medications   EXCEDRIN MIGRAINE 250-250-65 MG tablet Generic drug:  aspirin-acetaminophen-caffeine     TAKE these medications   hydrOXYzine 25 MG tablet Commonly known as:  ATARAX/VISTARIL Take 1 tablet (25 mg total) by mouth every 6 (six) hours as needed for itching.   multivitamin-prenatal 27-0.8 MG Tabs tablet Take 1 tablet by mouth daily at 12 noon.       Diet: routine diet  Activity: Advance as tolerated. Pelvic rest for 6 weeks.   Outpatient follow up:2 weeks Follow up Appt: Future Appointments  Date Time Provider [place]  07/26/2017  9:30 AM Seabron Spates, CNM [facility] None   Follow up Visit:No Follow-up on file.  Postpartum contraception: Not Discussed  Newborn Data: Live born female  Birth Weight: 7 lb 4 oz (3289 g) APGAR: 9, 9  Newborn Delivery   Birth date/time:  07/21/2017 17:40:00 Delivery type:  Vaginal, Spontaneous     Baby Feeding: Bottle Disposition:being transferred   07/22/2017 Sherene Sires, DO

## 2017-07-22 NOTE — Progress Notes (Signed)
POSTPARTUM PROGRESS NOTE  Post Partum Day #1 Subjective:  Courtney Farley is a 36 y.o. [id number] [redacted]w[redacted]d s/p SVD.  No acute events overnight.  Pt denies problems with ambulating, voiding or po intake.  She denies nausea or vomiting.  Pain is well controlled.  She has had flatus. She has had bowel movement.  Lochia Minimal.   Objective: Blood pressure (!) 127/57, pulse (!) 102, temperature 98.5 F (36.9 C), temperature source Oral, resp. rate 16, height 5\' 4"  (1.626 m), weight 178 lb (80.7 kg), last menstrual period 11/02/2016, SpO2 100 %, unknown if currently breastfeeding.  Physical Exam:  General: alert, cooperative and no distress Lochia:normal flow Chest: no respiratory distress Heart:regular rate, distal pulses intact Abdomen: soft, nontender,  Uterine Fundus: firm, appropriately tender DVT Evaluation: No calf swelling or tenderness Extremities: mild edema  Recent Labs    07/19/17 1754 07/20/17 1315  HGB 11.9* 12.2  HCT 36.1 37.5    Assessment/Plan:  ASSESSMENT: Courtney Farley is a 36 y.o. G3P2002 [redacted]w[redacted]d s/p SVD. Patient is clinically stable and progressing well. No acute complaints.  Plan for discharge tomorrow   LOS: 2 days   Marjie Skiff, MD 07/22/2017, 3:04 AM

## 2017-07-22 NOTE — Lactation Note (Signed)
This note was copied from a baby's chart. Lactation Consultation Note  Patient Name: Courtney Farley MRAJH'H Date: 07/22/2017 Reason for consult: Initial assessment   P3, Ex BF for 3 years.  Nipples evert and compressible. Mother attempted bf but baby not hungry at this time. Mom encouraged to feed baby 8-12 times/24 hours and with feeding cues.  Mom made aware of O/P services, breastfeeding support groups, community resources, and our phone # for post-discharge questions.      Maternal Data Has patient been taught Hand Expression?: Yes Does the patient have breastfeeding experience prior to this delivery?: Yes  Feeding Feeding Type: Breast Fed Length of feed: 60 min  LATCH Score                   Interventions    Lactation Tools Discussed/Used     Consult Status Consult Status: Follow-up Date: 07/23/17 Follow-up type: In-patient    Vivianne Master [facility] 07/22/2017, 12:14 PM

## 2017-07-22 NOTE — Anesthesia Postprocedure Evaluation (Signed)
Anesthesia Post Note  Patient: Courtney Farley  Procedure(s) Performed: AN AD HOC LABOR EPIDURAL     Anesthesia Type: Epidural    Last Vitals:  Vitals:   07/22/17 0222 07/22/17 0859  BP: (!) 127/57 110/62  Pulse: (!) 102 98  Resp: 16 19  Temp: 36.9 C 37.2 C  SpO2:      Last Pain:  Vitals:   07/22/17 0859  TempSrc: Oral  PainSc:    Pain Goal:                 Casimer Lanius

## 2017-07-24 ENCOUNTER — Other Ambulatory Visit: Payer: Self-pay

## 2017-07-24 ENCOUNTER — Encounter (HOSPITAL_BASED_OUTPATIENT_CLINIC_OR_DEPARTMENT_OTHER): Payer: Self-pay | Admitting: *Deleted

## 2017-07-24 ENCOUNTER — Emergency Department (HOSPITAL_BASED_OUTPATIENT_CLINIC_OR_DEPARTMENT_OTHER)
Admission: EM | Admit: 2017-07-24 | Discharge: 2017-07-25 | Disposition: A | Discharge status: Home / Self Care | Payer: Medicaid Other | Attending: Emergency Medicine | Admitting: Emergency Medicine

## 2017-07-24 ENCOUNTER — Emergency Department (HOSPITAL_BASED_OUTPATIENT_CLINIC_OR_DEPARTMENT_OTHER): Payer: Medicaid Other

## 2017-07-24 DIAGNOSIS — Z87891 Personal history of nicotine dependence: Secondary | ICD-10-CM | POA: Insufficient documentation

## 2017-07-24 DIAGNOSIS — O9989 Other specified diseases and conditions complicating pregnancy, childbirth and the puerperium: Secondary | ICD-10-CM | POA: Diagnosis not present

## 2017-07-24 DIAGNOSIS — R0602 Shortness of breath: Secondary | ICD-10-CM | POA: Insufficient documentation

## 2017-07-24 DIAGNOSIS — R2243 Localized swelling, mass and lump, lower limb, bilateral: Secondary | ICD-10-CM | POA: Diagnosis present

## 2017-07-24 DIAGNOSIS — R600 Localized edema: Secondary | ICD-10-CM

## 2017-07-24 LAB — URINALYSIS, ROUTINE W REFLEX MICROSCOPIC
Bilirubin Urine: NEGATIVE
Glucose, UA: NEGATIVE mg/dL
KETONES UR: NEGATIVE mg/dL
NITRITE: NEGATIVE
PH: 6 (ref 5.0–8.0)
PROTEIN: NEGATIVE mg/dL
Specific Gravity, Urine: 1.025 (ref 1.005–1.030)

## 2017-07-24 LAB — URINALYSIS, MICROSCOPIC (REFLEX)

## 2017-07-24 NOTE — ED Notes (Signed)
Pt at [facility] getting into gown. Xray present ready to take pt to xray. Alert, NAD, calm, interactive, resps e/u, speaking in clear complete sentences, no dyspnea noted, skin W&D, mentions generalized discomfort, 7/10. Pt to xray by stretcher.

## 2017-07-24 NOTE — ED Triage Notes (Signed)
Pt had baby on Thursday. States she was induced early due to pre-eclamptic Sx. Reports both legs have been swollen since then. Reports groin pain, right shoulder pain , and neck pain. C/o SOB also

## 2017-07-25 ENCOUNTER — Ambulatory Visit: Payer: Medicaid Other

## 2017-07-25 LAB — COMPREHENSIVE METABOLIC PANEL
ALBUMIN: 2.7 g/dL — AB (ref 3.5–5.0)
ALK PHOS: 83 U/L (ref 38–126)
ALT: 36 U/L (ref 14–54)
AST: 48 U/L — AB (ref 15–41)
Anion gap: 5 (ref 5–15)
BILIRUBIN TOTAL: 0.4 mg/dL (ref 0.3–1.2)
BUN: 6 mg/dL (ref 6–20)
CALCIUM: 8.1 mg/dL — AB (ref 8.9–10.3)
CO2: 24 mmol/L (ref 22–32)
CREATININE: 0.62 mg/dL (ref 0.44–1.00)
Chloride: 108 mmol/L (ref 101–111)
GFR calc Af Amer: >60 mL/min (ref >60)
GFR calc non Af Amer: >60 mL/min (ref >60)
GLUCOSE: 84 mg/dL (ref 65–99)
Potassium: 3.5 mmol/L (ref 3.5–5.1)
SODIUM: 137 mmol/L (ref 135–145)
Total Protein: 6 g/dL — ABNORMAL LOW (ref 6.5–8.1)

## 2017-07-25 LAB — CBC
HEMATOCRIT: 34.6 % — AB (ref 36.0–46.0)
HEMOGLOBIN: 11.4 g/dL — AB (ref 12.0–15.0)
MCH: 27.1 pg (ref 26.0–34.0)
MCHC: 32.9 g/dL (ref 30.0–36.0)
MCV: 82.2 fL (ref 78.0–100.0)
Platelets: 377 10*3/uL (ref 150–400)
RBC: 4.21 MIL/uL (ref 3.87–5.11)
RDW: 13.5 % (ref 11.5–15.5)
WBC: 9 10*3/uL (ref 4.0–10.5)

## 2017-07-25 LAB — APTT: APTT: 25 s (ref 24–36)

## 2017-07-25 LAB — PROTIME-INR
INR: 0.89
Prothrombin Time: 12 seconds (ref 11.4–15.2)

## 2017-07-25 NOTE — Discharge Instructions (Signed)
Please call you ob gyn for follow up in 2-3 days for follow up  Return to the ER for new or worsening symptoms

## 2017-07-25 NOTE — ED Provider Notes (Signed)
[facility] EMERGENCY DEPARTMENT Provider Note   CSN: [id number] Arrival date & time: 07/24/17  2244     History   Chief Complaint Chief Complaint  Patient presents with  . Leg Swelling    HPI Courtney Farley is a 36 y.o. female.  HPI Patient is a 36 year old G3P3 who is postpartum day #4 after a vaginal delivery.  She was induced at 37 weeks and 1 day secondary to gestational hypertension which was diagnosed late in pregnancy.  No prior history of hypertension.  Her blood pressures normalized in the hospital and she was discharged 2 days ago.  She presents the emergency department with mild increasing lower extremity edema and some mild shortness of breath with exertion which was present during pregnancy but has not resolved to the degree with which she expected.  Her newborn child is currently at [facility] being evaluated for coarctation of the aorta.  Patient has been traveling back and forth from [place] to her home here in [place].  No prior history of preeclampsia.  Not on any medication for blood pressure.  She is concerned that her blood pressure is elevated and is causing the symptoms.  No headache or blurry vision.  No abdominal pain.  Scant vaginal bleeding.  Denies chest pain.  No unilateral leg pain.  No history of DVT or pulmonary embolism   Past Medical History:  Diagnosis Date  . HPV in female   . Migraine     Patient Active Problem List   Diagnosis Date Noted  . Gestational hypertension 07/20/2017  . Labile blood pressure 07/19/2017  . Itching 07/12/2017  . Abnormal fetal echocardiogram, affecting care of mother, antepartum 06/02/2017  . Uterine size date discrepancy, antepartum 06/02/2017  . Uterine fibroid during pregnancy, antepartum 03/20/2017  . Increased nuchal translucency space on fetal ultrasound 02/04/2017  . Supervision of other normal pregnancy, antepartum 01/17/2017  . Abnormal cervical Papanicolaou smear affecting  pregnancy, antepartum 01/17/2017  . History of cervical LEEP biopsy affecting care of mother, antepartum 01/17/2017  . Maternal tobacco use, antepartum 01/17/2017    Past Surgical History:  Procedure Laterality Date  . GANGLION CYST EXCISION    . LEEP      OB History    Gravida Para Term Preterm AB Living   3 2 2     2    SAB TAB Ectopic Multiple Live Births                   Home Medications    Prior to Admission medications   Medication Sig Start Date End Date Taking? Authorizing Provider  hydrOXYzine (ATARAX/VISTARIL) 25 MG tablet Take 1 tablet (25 mg total) by mouth every 6 (six) hours as needed for itching. 07/19/17   Seabron Spates, CNM  Prenatal Vit-Fe Fumarate-FA (MULTIVITAMIN-PRENATAL) 27-0.8 MG TABS tablet Take 1 tablet by mouth daily at 12 noon. Patient not taking: Reported on 07/19/2017 03/31/17   Truett Mainland, DO    Family History Family History  Problem Relation Age of Onset  . Hypertension Mother   . Hypertension Father   . Congenital adrenal hyperplasia Other   . Congenital adrenal hyperplasia Other     Social History Social History   Tobacco Use  . Smoking status: Former Smoker    Years: 1.00    Types: Cigars  . Smokeless tobacco: Never Used  Substance Use Topics  . Alcohol use: No  . Drug use: No     Allergies   Erythromycin  Review of Systems Review of Systems  All other systems reviewed and are negative.    Physical Exam Updated Vital Signs BP 119/85   Pulse 88   Temp 98.7 F (37.1 C) (Oral)   Resp 18   LMP 11/02/2016   SpO2 100%   Breastfeeding? Yes   Physical Exam  Constitutional: She is oriented to person, place, and time. She appears well-developed and well-nourished. No distress.  HENT:  Head: Normocephalic and atraumatic.  Eyes: EOM are normal.  Neck: Normal range of motion.  Cardiovascular: Normal rate, regular rhythm and normal heart sounds.  Pulmonary/Chest: Effort normal and breath sounds normal.    Abdominal: Soft. She exhibits no distension. There is no tenderness.  Musculoskeletal: Normal range of motion.  Trace edema bilaterally  Neurological: She is alert and oriented to person, place, and time.  Skin: Skin is warm and dry.  Psychiatric: She has a normal mood and affect. Judgment normal.  Nursing note and vitals reviewed.    ED Treatments / Results  Labs (all labs ordered are listed, but only abnormal results are displayed) Labs Reviewed  CBC - Abnormal; Notable for the following components:      Result Value   Hemoglobin 11.4 (*)    HCT 34.6 (*)    All other components within normal limits  COMPREHENSIVE METABOLIC PANEL - Abnormal; Notable for the following components:   Calcium 8.1 (*)    Total Protein 6.0 (*)    Albumin 2.7 (*)    AST 48 (*)    All other components within normal limits  URINALYSIS, ROUTINE W REFLEX MICROSCOPIC - Abnormal; Notable for the following components:   APPearance CLOUDY (*)    Hgb urine dipstick LARGE (*)    Leukocytes, UA TRACE (*)    All other components within normal limits  URINALYSIS, MICROSCOPIC (REFLEX) - Abnormal; Notable for the following components:   Bacteria, UA MANY (*)    Squamous Epithelial / LPF 6-30 (*)    All other components within normal limits  PROTIME-INR  APTT    EKG  EKG Interpretation None       Radiology Dg Chest 2 View  Result Date: 07/24/2017 CLINICAL DATA:  Shortness of breath and bilateral lower extremity swelling postpartum. EXAM: CHEST  2 VIEW COMPARISON:  10/28/2010 FINDINGS: Pectus excavatum deformity. Mild thoracic scoliosis convex towards the right. Normal heart size and pulmonary vascularity. No focal airspace disease or consolidation in the lungs. No blunting of costophrenic angles. No pneumothorax. Mediastinal contours appear intact. IMPRESSION: No active cardiopulmonary disease. Electronically Signed   By: Lucienne Capers M.D.   On: 07/24/2017 23:55    Procedures Procedures  (including critical care time)  Medications Ordered in ED Medications - No data to display   Initial Impression / Assessment and Plan / ED Course  I have reviewed the triage vital signs and the nursing notes.  Pertinent labs & imaging results that were available during my care of the patient were reviewed by me and considered in my medical decision making (see chart for details).     Elevated blood pressure on arrival to the emergency department which quickly normalized without intervention.  No protein in urine.  Blood pressure normalizing.  LFTs normal.  Chest x-ray normal.  Doubt DVT and pulmonary embolism.  No increased work of breathing.  Lung sounds are clear.  Patient is safe to be discharged home to follow-up with her obstetrician.  She understands return to the ER for new or worsening symptoms.  I do not think she needs additional workup at this time.  Final Clinical Impressions(s) / ED Diagnoses   Final diagnoses:  Lower extremity edema    ED Discharge Orders    None       Jola Schmidt, MD 07/25/17 [phone]

## 2017-07-26 ENCOUNTER — Other Ambulatory Visit: Payer: Medicaid Other

## 2017-08-01 ENCOUNTER — Ambulatory Visit (INDEPENDENT_AMBULATORY_CARE_PROVIDER_SITE_OTHER): Payer: Medicaid Other | Admitting: Obstetrics & Gynecology

## 2017-08-01 ENCOUNTER — Encounter: Payer: Self-pay | Admitting: Obstetrics & Gynecology

## 2017-08-01 VITALS — BP 142/90 | HR 89 | Wt 170.0 lb

## 2017-08-01 DIAGNOSIS — O135 Gestational [pregnancy-induced] hypertension without significant proteinuria, complicating the puerperium: Secondary | ICD-10-CM

## 2017-08-01 MED ORDER — AMLODIPINE BESYLATE 5 MG PO TABS: 5.0000 mg | ORAL_TABLET | Freq: Every day | ORAL | 2 refills | Status: AC

## 2017-08-01 NOTE — Progress Notes (Signed)
History:  36 y.o. G3P2002 here today for 1 1/2 week BP check. Pt is s/p SVD. 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 She reports begin stressed today because this is the first day that she has left the baby at [facility].  She is not interested in contraception . She reports abstinence.   The following portions of the patient's history were reviewed and updated as appropriate: allergies, current medications, past family history, past medical history, past social history, past surgical history and problem list.  Review of Systems:  Pertinent items are noted in HPI.   Objective:  Physical Exam Blood pressure (!) 146/89, pulse 85, weight 170 lb (77.1 kg), last menstrual period 11/02/2016, currently breastfeeding.  Rrepeat 142/90 CONSTITUTIONAL: Well-developed, well-nourished female in no acute distress.  HENT:  Normocephalic, atraumatic EYES: Conjunctivae and EOM are normal. No scleral icterus.  NECK: Normal range of motion SKIN: Skin is warm and dry. No rash noted. Not diaphoretic.No pallor. [place]: Alert and oriented to person, place, and time. Normal coordination.  Ext: no edema. Pt has on compression socks  labs and Imaging Dg Chest 2 View  Result Date: 07/24/2017 CLINICAL DATA:  Shortness of breath and bilateral lower extremity swelling postpartum. EXAM: CHEST  2 VIEW COMPARISON:  10/28/2010 FINDINGS: Pectus excavatum deformity. Mild thoracic scoliosis convex towards the right. Normal heart size and pulmonary vascularity. No focal airspace disease or consolidation in the lungs. No blunting of costophrenic angles. No pneumothorax. Mediastinal contours appear intact. IMPRESSION: No active cardiopulmonary disease. Electronically Signed   By: Lucienne Capers M.D.   On: 07/24/2017 23:55   [place] Mfm Ob Follow Up  Result Date: 07/06/2017 ----------------------------------------------------------------------  OBSTETRICS REPORT                       (Signed Final 07/06/2017 07:40 pm) ---------------------------------------------------------------------- Patient Info  ID #:       [id number]                          D.O.B.:  [birth year] (36 yrs)  Name:       Courtney Farley                   Visit Date: 07/06/2017 11:42 am ---------------------------------------------------------------------- Performed By  Performed By:     Berlinda Last          Ref. Address:     [address] [place],                                                             [place] [address]  Attending:        Renella Cunas MD       [facility]  Referred By:      [name] MD ---------------------------------------------------------------------- Orders   #  Description                                 Code   1  [place] MFM OB FOLLOW UP                         [phone]  ----------------------------------------------------------------------   #  Ordered By               Order #        Accession #    Episode #   1  Renella Cunas            [id number]      [id number]     [id number]  ---------------------------------------------------------------------- Indications   [redacted] weeks gestation of pregnancy                Z3A.35   Fetal abnormality - other known or             O35.9XX0   suspected (thickened NT, low risk NIPS)   Previous cervical surgery (LEEP x2)            O34.40   Uterine fibroids affecting pregnancy in third  O34.13, D25.9   trimester, antepartum   Fetal cardiac anomaly affecting pregnancy,     O35.8XX0   antepartum (perimembranous VSD)   Uterine size-date discrepancy, third trimester O26.843   (S >D)  ---------------------------------------------------------------------- OB History  Blood Type:            Height:  5'4"   Weight (lb):  158       BMI:  27.12  Gravidity:    3         Term:   2        Prem:   0        SAB:   0   TOP:          0       Ectopic:  0        Living: 2 ---------------------------------------------------------------------- Fetal Evaluation  Num Of Fetuses:     1  Fetal Heart         138  Rate(bpm):  Cardiac Activity:   Observed  Presentation:       Cephalic  Placenta:           Posterior, above cervical os  P. Cord Insertion:  Previously Visualized  Amniotic Fluid  AFI FV:      Subjectively within normal limits  AFI Sum(cm)     %Tile       Largest Pocket(cm)  17.94           66          6.57  RUQ(cm)       RLQ(cm)       LUQ(cm)        LLQ(cm)  2.89          4.85          3.63           6.57 ---------------------------------------------------------------------- Biometry  BPD:  89.2  mm     G. Age:  36w 1d         79  %    CI:        75.64   %    70 - 86                                                          FL/HC:      22.1   %    20.1 - 22.3  HC:      325.2  mm     G. Age:  36w 6d         59  %    HC/AC:      1.01        0.93 - 1.11  AC:       322   mm     G. Age:  36w 1d         81  %    FL/BPD:     80.5   %    71 - 87  FL:       71.8  mm     G. Age:  36w 6d         82  %    FL/AC:      22.3   %    20 - 24  Est. FW:    2918  gm      6 lb 7 oz     82  % ---------------------------------------------------------------------- Gestational Age  LMP:           35w 1d        Date:  11/02/16                 EDD:   08/09/17  U/S Today:     36w 4d                                        EDD:   07/30/17  Best:          35w 1d     Det. By:  LMP  (11/02/16)          EDD:   08/09/17 ---------------------------------------------------------------------- Anatomy  Cranium:               Appears normal         Aortic Arch:            Previously seen  Cavum:                 Previously seen        Ductal Arch:            Previously seen  Ventricles:            Previously seen        Diaphragm:              Previously seen  Choroid Plexus:        Previously seen        Stomach:                Appears normal, left  sided  Cerebellum:            Previously seen        Abdomen:                Appears normal  Posterior Fossa:       Previously seen        Abdominal Wall:         Previously seen  Nuchal Fold:           Previously seen        Cord Vessels:           Previously seen                         enlarged  Face:                  Orbits and profile     Kidneys:                Appear normal                         previously seen  Lips:                  Previously seen        Bladder:                Appears normal  Thoracic:              Appears normal         Spine:                  Previously seen  Heart:                 Previously seen        Upper Extremities:      Previously seen  RVOT:                  Appears normal         Lower Extremities:      Previously seen  LVOT:                  Appears normal  Other:  Female gender. Heels and 5th digit previously visualized. Nasal bone          previously visualized. Open hands previously visualized. ---------------------------------------------------------------------- Cervix Uterus Adnexa  Cervix  Not visualized (advanced GA >29wks) ---------------------------------------------------------------------- Impression  SIUP at 97+0 weeks  Cephalic presentation  Fetus with VSD  All other interval fetal anatomy was seen and appeared  normal; anatomic survey complete  Normal amniotic fluid volume  Appropriate interval growth with EFW at the 82nd %tile ---------------------------------------------------------------------- Recommendations  Follow-up as clinically indicated ----------------------------------------------------------------------                 Renella Cunas, MD Electronically Signed Final Report   07/06/2017 07:40 pm ----------------------------------------------------------------------  UA:  Assessment & Plan:  Elevated BP post partum: pt with a h/o gestation al HTN  Begin Norvasc 5mg  q day Pt to have BP check  at [facility] by one of the nurses. She will call [facility] with the results.   Otho Michalik L. Harraway-Smith, M.D., Cherlynn June

## 2017-08-01 NOTE — Progress Notes (Signed)
Patient states swelling has been better with compression socks. Patient has had headaches. Kathrene Alu RNBSN

## 2017-08-02 ENCOUNTER — Encounter (HOSPITAL_COMMUNITY): Payer: Self-pay | Admitting: *Deleted

## 2017-08-29 IMAGING — US US MFM OB TRANSVAGINAL
1 series · 13 of 28 positions shown · non-contrast
Comparison: none

MOCKY

TAZUKA
2  RHOEL KELEMEN            [id number]      [id number]     [id number]
Indications
19 weeks gestation of pregnancy
Encounter for fetal anatomic survey
Fetal abnormality - other known or
suspected (thickened NT, low risk NIPS)
Uterine fibroids affecting pregnancy in        O34.12,
second trimester, antepartum
Encounter for cervical length
Previous cervical surgery (LEEP x2)
OB History
Blood Type:            Height:  5'4"   Weight (lb):  158      BMI:
Gravidity:    3         Term:   2        Prem:   0        SAB:   0
TOP:          0       Ectopic:  0        Living: 2
Fetal Evaluation
Num Of Fetuses:     1
Fetal Heart         159
Rate(bpm):
Cardiac Activity:   Observed
Presentation:       Transverse, head to maternal right
Placenta:           Posterior, above cervical os
P. Cord Insertion:  Visualized
Amniotic Fluid
AFI FV:      Subjectively within normal limits
Largest Pocket(cm)
6.69
Biometry
BPD:      43.8  mm     G. Age:  19w 2d         62  %    CI:        68.61   %   70 - 86
FL/HC:      18.1   %   16.1 -
HC:       169   mm     G. Age:  19w 4d         68  %    HC/AC:      1.07       1.09 -
AC:      157.4  mm     G. Age:  20w 6d         93  %    FL/BPD:     69.9   %
FL:       30.6  mm     G. Age:  19w 3d         60  %    FL/AC:      19.4   %   20 - 24
HUM:      28.1  mm     G. Age:  19w 0d         51  %
CER:      20.7  mm     G. Age:  19w 5d         66  %
NFT:       7.8  mm
CM:        5.3  mm
Est. FW:     334  gm    0 lb 12 oz      61  %
Gestational Age
LMP:           19w 0d       Date:   11/02/16                 EDD:   08/09/17
U/S Today:     19w 6d                                        EDD:   08/03/17
Best:          19w 0d    Det. By:   LMP  (11/02/16)          EDD:   08/09/17
Anatomy
Cranium:               Appears normal         LVOT:                   Appears normal
Cavum:                 Appears normal         Aortic Arch:            Appears normal
Ventricles:            Appears normal         Ductal Arch:            Appears normal
Choroid Plexus:        Appears normal         Diaphragm:              Appears normal
Cerebellum:            Appears normal         Stomach:                Appears normal, left
sided
Posterior Fossa:       Appears normal         Abdomen:                Appears normal
Nuchal Fold:           Appears enlarged,      Abdominal Wall:         Appears nml (cord
7.8 mm
insert, abd wall)
Face:                  Appears normal         Cord Vessels:           Appears normal (3
(orbits and profile)                           vessel cord)
Lips:                  Appears normal         Kidneys:                Appear normal
Palate:                Appears normal         Bladder:                Appears normal
Thoracic:              Appears normal         Spine:                  Appears normal
Heart:                 Appears normal         Upper Extremities:      Appears normal
(4CH, axis, and
situs)
RVOT:                  Appears normal         Lower Extremities:      Appears normal
Other:  Male gender. Heels and 5th digit visualized. Nasal bone visualized.
Open hands visualized.
Cervix Uterus Adnexa
Cervix
Length:            2.8  cm.
Measured transvaginally.
Uterus
Multiple fibroids noted, see table below.
Left Ovary
Within normal limits.
Right Ovary
Cul De Sac:   No free fluid seen.
Adnexa:       No abnormality visualized.
Myomas
Site                     L(cm)      W(cm)      D(cm)      Location
Posterior
Right Lateral            4.3        3.9        3
Blood Flow                 RI        PI       Comments
Impression
INDICATION: 36 yr old DNFEVVE at 57w4d with previous finding
of thickened nuchal translucency for fetal anatomic survey.
History of LEEP.

[Series 1: us mfm ob transvaginal · 122 acquisitions, 13 frames shown]
[im 5/122]
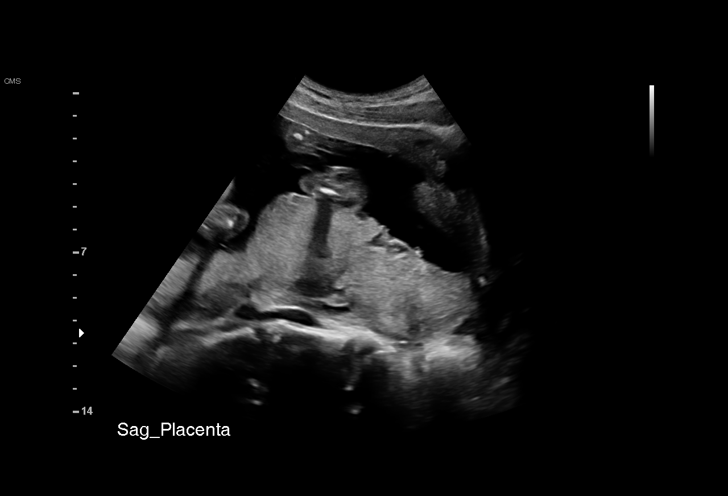
[im 14/122]
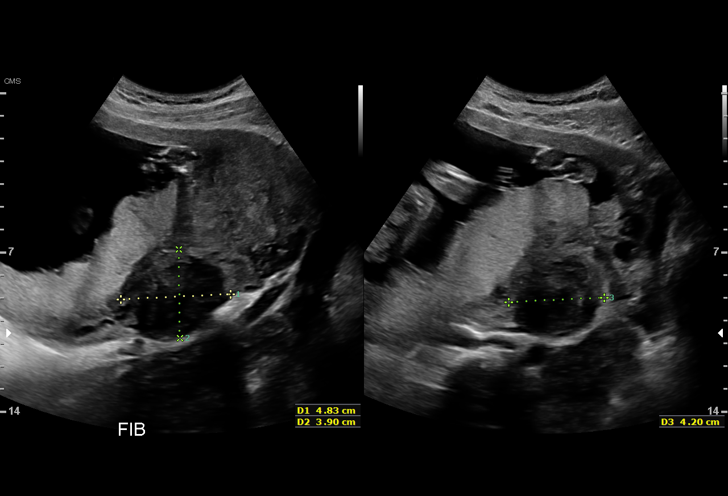
[im 23/122]
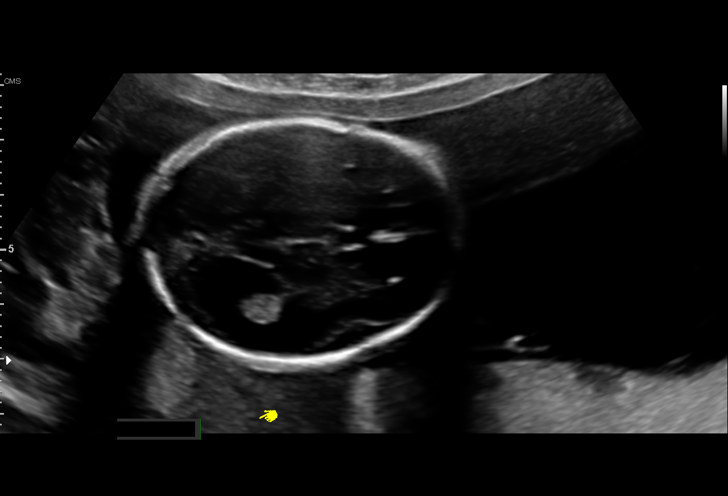
[im 32/122]
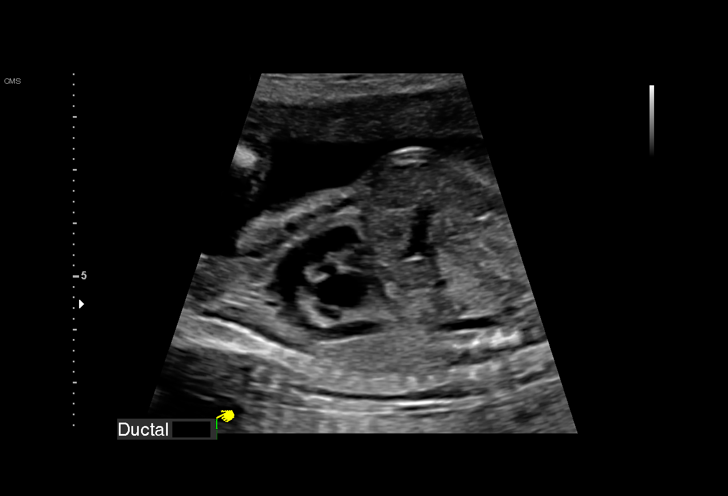
[im 41/122]
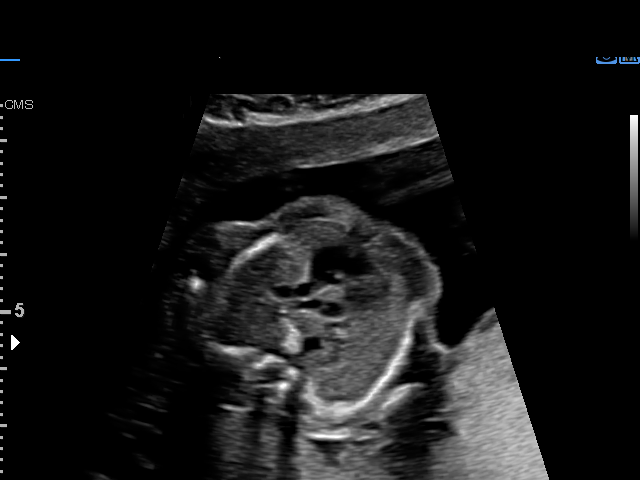
[im 50/122]
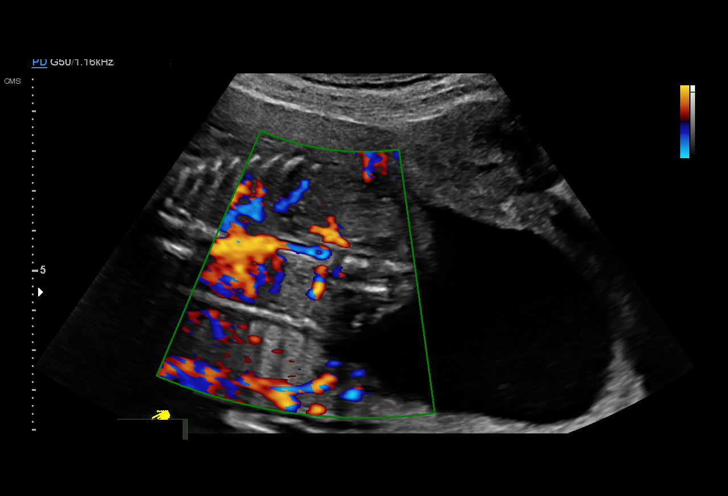
[im 63/122]
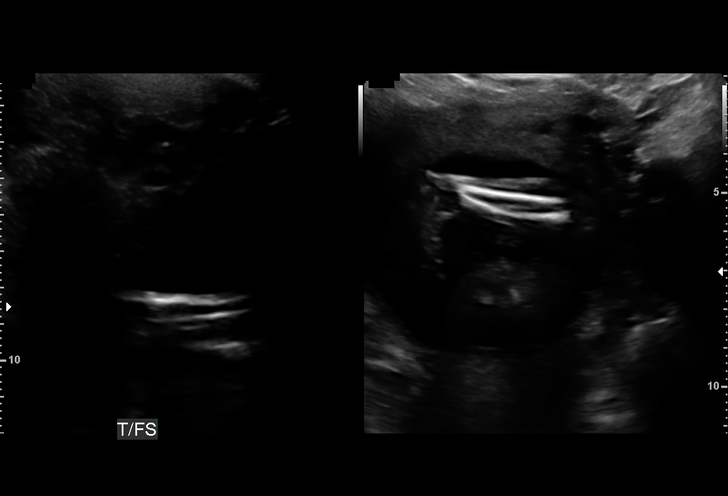
[im 72/122]
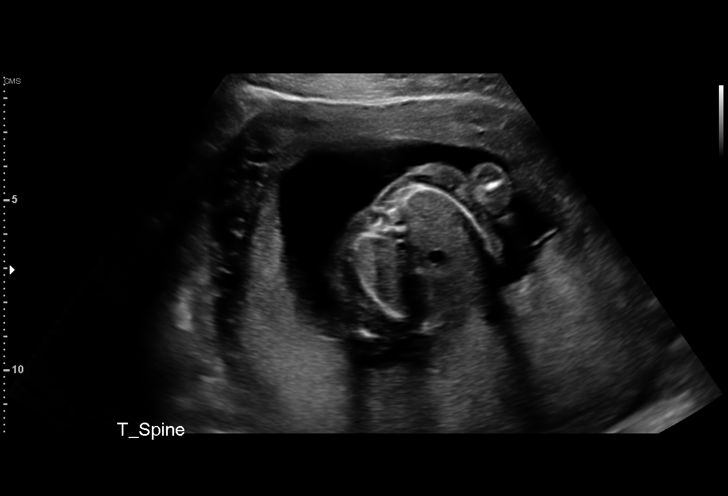
[im 81/122]
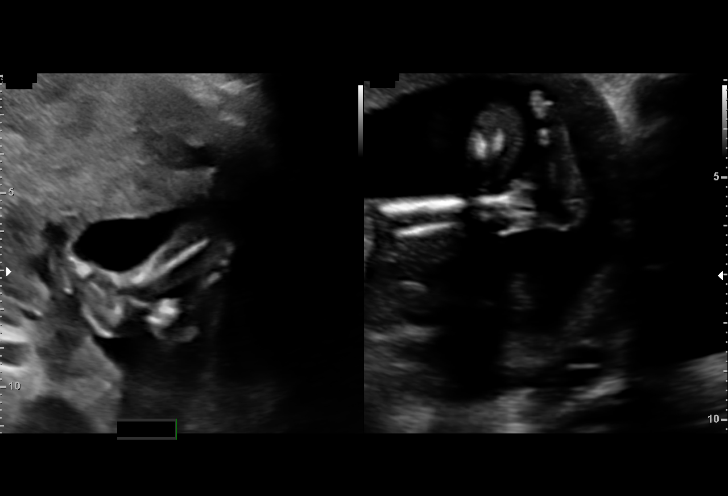
[im 90/122]
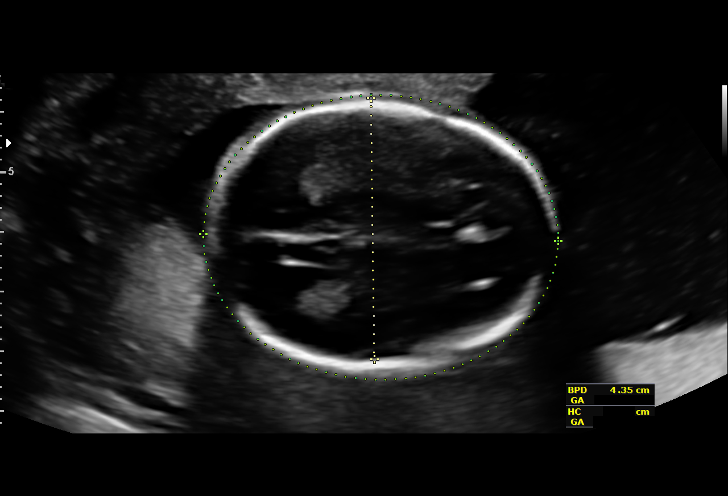
[im 99/122]
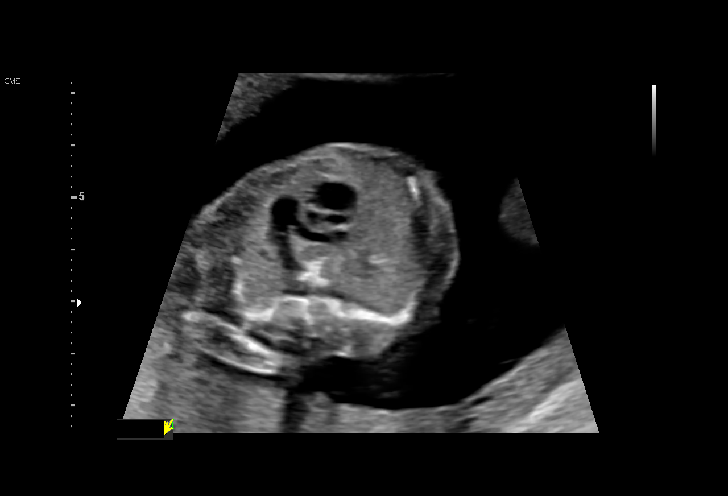
[im 108/122]
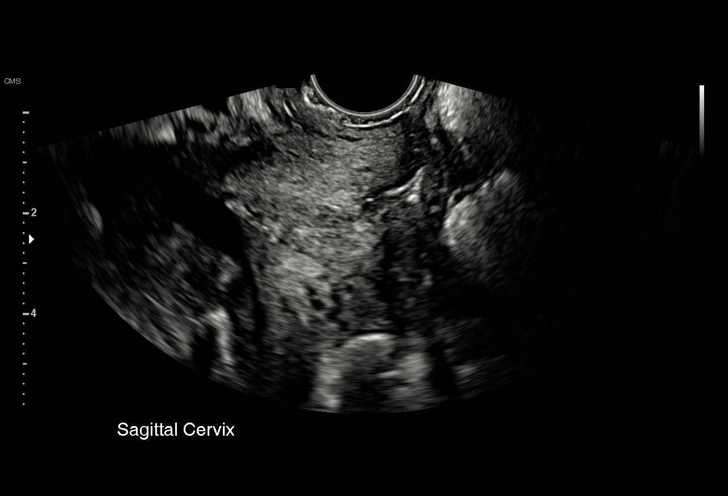
[im 117/122]
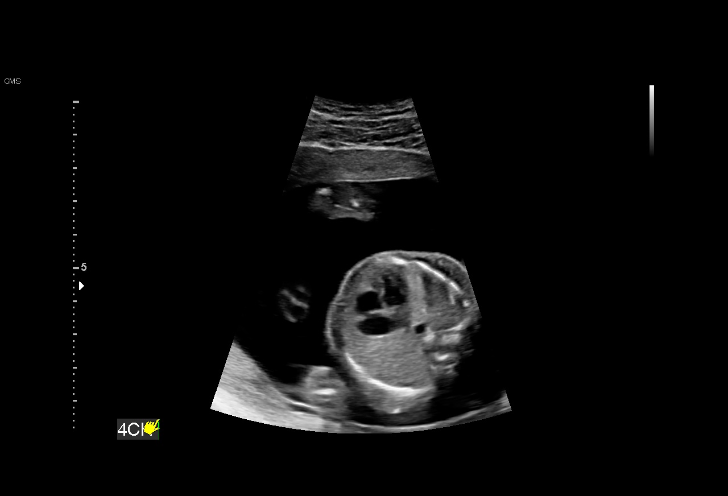

[13 of 28 positions shown; findings below may reference images not displayed]

FINDINGS: 1. Single intrauterine pregnancy.
2. Fetal biometry is consistent with dating.
3. Posterior placenta without evidence of previa.
4. Normal amniotic fluid volume.
5. Normal transvaginal cervical length.
6. Again several fibroids are seen; the largest measures
4.8cm.
7. The nuchal fold is enlarged at 7.8mm.
8. The remainder of the fetal anatomic survey is normal.
Recommendations

1. Appropriate fetal growth.
2. Fetal anatomic survey is complete.
3. Enlarged nuchal fold/ previous finding of thickened nuchal
translucency:
- discussed thickened nuchal fold is likely related to thickened
nuchal translucency in the first trimester
- previously counseled
- had low risk cell free fetal DNA
- normal anatomy
- fetal echocardiogram is scheduled
- recommend evaluate fetal growth in the third trimester
4. History of LEEP:
- normal cervical length today
- no further cervical length surveillance indicated unless
symptoms develop
5. Normal MSAFP
6. Fibroids:
- recommend serial fetal growth
7. Family history of congenital adrenal hyperplasia:
- previously counseled
- male fetus

## 2017-08-31 ENCOUNTER — Ambulatory Visit (INDEPENDENT_AMBULATORY_CARE_PROVIDER_SITE_OTHER): Payer: Medicaid Other | Admitting: Obstetrics & Gynecology

## 2017-08-31 ENCOUNTER — Encounter: Payer: Self-pay | Admitting: Obstetrics & Gynecology

## 2017-08-31 DIAGNOSIS — R87611 Atypical squamous cells cannot exclude high grade squamous intraepithelial lesion on cytologic smear of cervix (ASC-H): Secondary | ICD-10-CM
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
## 2017-09-01 ENCOUNTER — Encounter: Payer: Self-pay | Admitting: Obstetrics & Gynecology

## 2017-09-26 ENCOUNTER — Ambulatory Visit (INDEPENDENT_AMBULATORY_CARE_PROVIDER_SITE_OTHER): Payer: Medicaid Other | Admitting: Obstetrics & Gynecology

## 2017-09-26 ENCOUNTER — Encounter: Payer: Self-pay | Admitting: Obstetrics & Gynecology

## 2017-09-26 ENCOUNTER — Other Ambulatory Visit (HOSPITAL_COMMUNITY)
Admission: RE | Admit: 2017-09-26 | Discharge: 2017-09-26 | Disposition: A | Discharge status: Home / Self Care | Payer: Medicaid Other | Source: Ambulatory Visit | Attending: Obstetrics & Gynecology | Admitting: Obstetrics & Gynecology

## 2017-09-26 VITALS — BP 126/89 | HR 91 | Wt 166.0 lb

## 2017-09-26 DIAGNOSIS — R87611 Atypical squamous cells cannot exclude high grade squamous intraepithelial lesion on cytologic smear of cervix (ASC-H): Secondary | ICD-10-CM | POA: Diagnosis not present

## 2017-09-26 DIAGNOSIS — R87619 Unspecified abnormal cytological findings in specimens from cervix uteri: Secondary | ICD-10-CM

## 2017-09-26 DIAGNOSIS — O3443 Maternal care for other abnormalities of cervix, third trimester: Secondary | ICD-10-CM

## 2017-09-26 DIAGNOSIS — Z1151 Encounter for screening for human papillomavirus (HPV): Secondary | ICD-10-CM | POA: Insufficient documentation

## 2017-09-26 DIAGNOSIS — O3440 Maternal care for other abnormalities of cervix, unspecified trimester: Secondary | ICD-10-CM

## 2017-09-26 IMAGING — MR MR PELVIS W/O CM
4 of 8 series · 19 of 48 positions shown · non-contrast
Comparison: Limited correlation made with obstetric ultrasound
03/15/2017

CLINICAL DATA: Right pelvic pain for 3 days. Palpable bulge
superior to the right inguinal canal on physical examination.
Question hernia or lymph node. Second trimester pregnancy.

EXAM:
MRI PELVIS WITHOUT CONTRAST
TECHNIQUE: Multiplanar multisequence MR imaging of the pelvis was performed. No
intravenous contrast was administered.

[Series 6: T2 · axial · 5.0mm · 0.70mm/px · z∈[-77,+187]mm · 6 of 45 slices shown (1 of 2)]
[im 1/45]
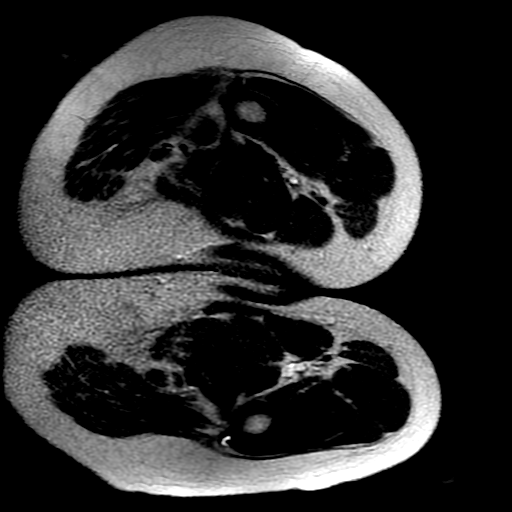
[im 9/45]
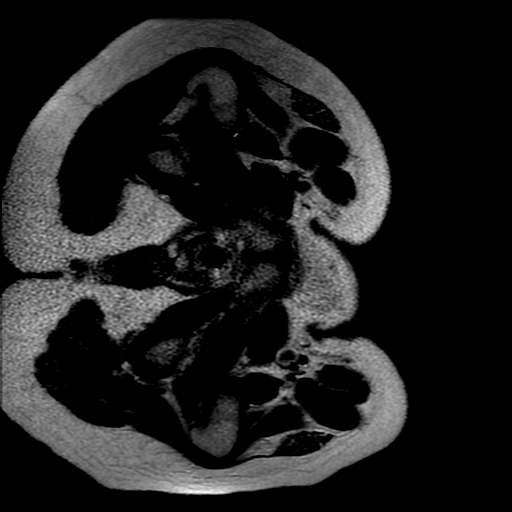
[im 18/45]
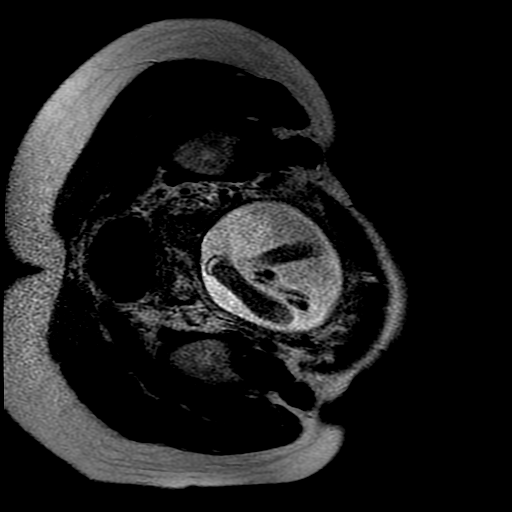
[im 27/45]
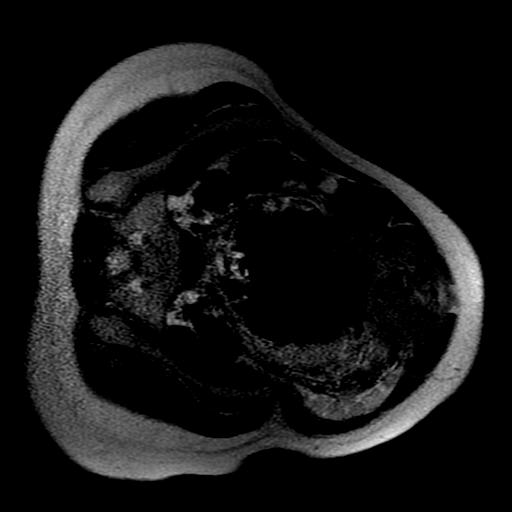
[im 36/45]
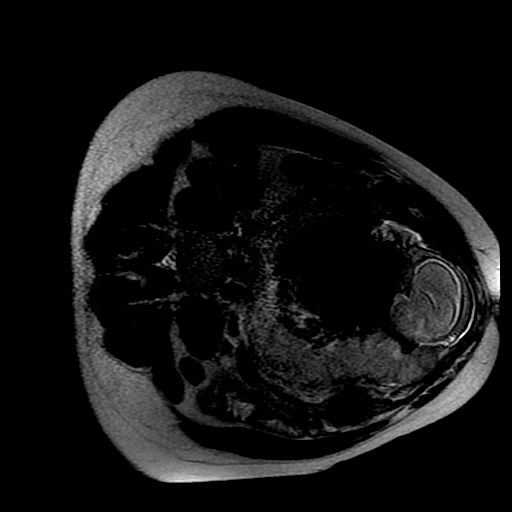
[im 45/45]
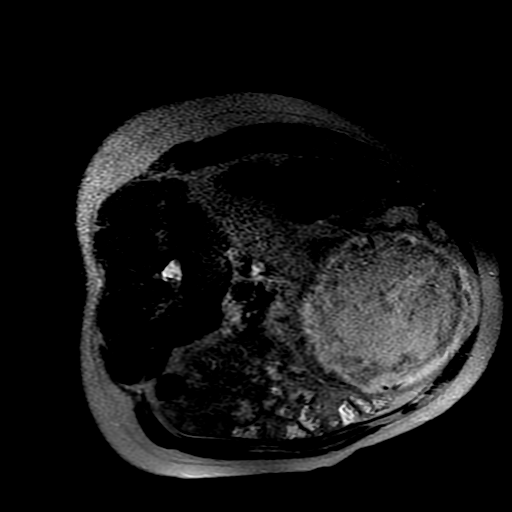

[Series 7: T2 fat-sat · axial · 5.0mm · 0.70mm/px · z∈[-77,+187]mm · 6 of 45 slices shown (1 of 2)]
[im 1/45]
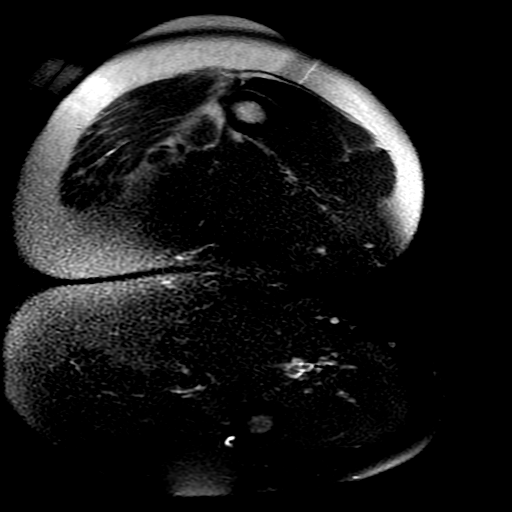
[im 9/45]
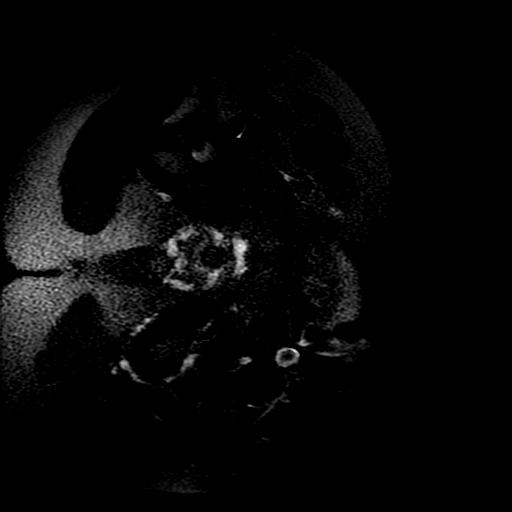
[im 18/45]
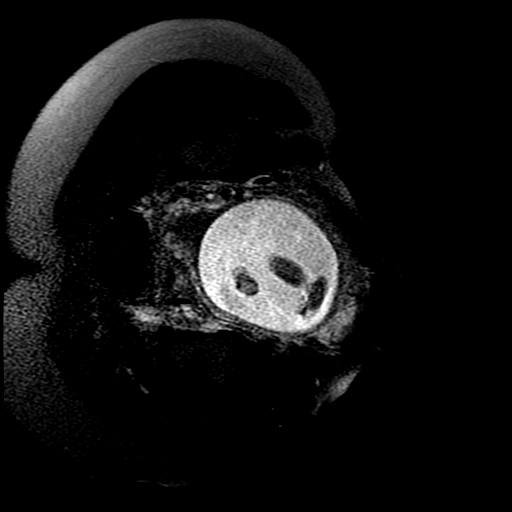
[im 27/45]
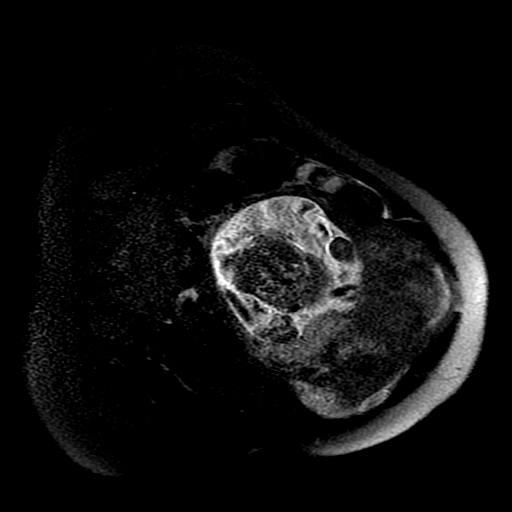
[im 36/45]
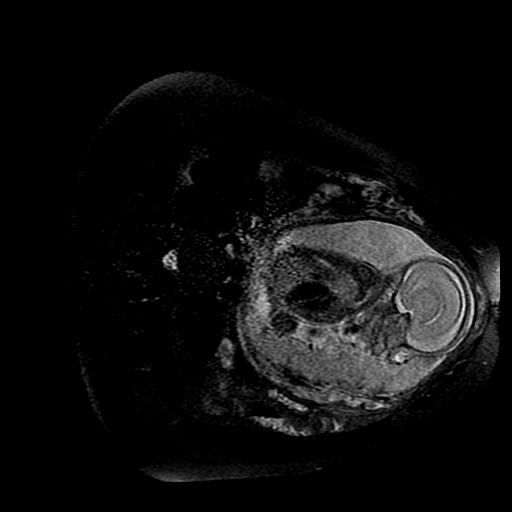
[im 45/45]
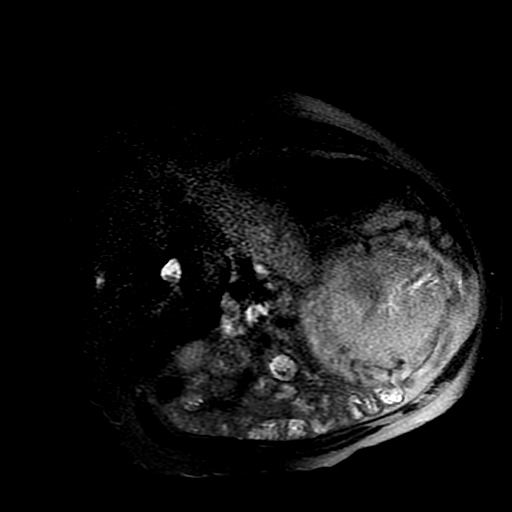

[Series 8: T2 · coronal · 6.0mm · 0.86mm/px · 4 of 39 slices shown (2 of 2)]
[im 1/39]
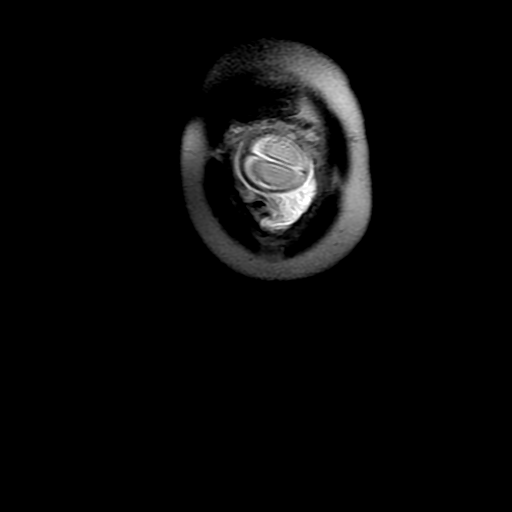
[im 8/39]
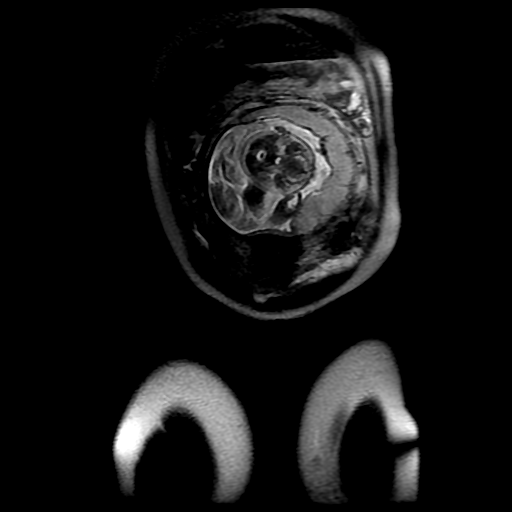
[im 23/39]
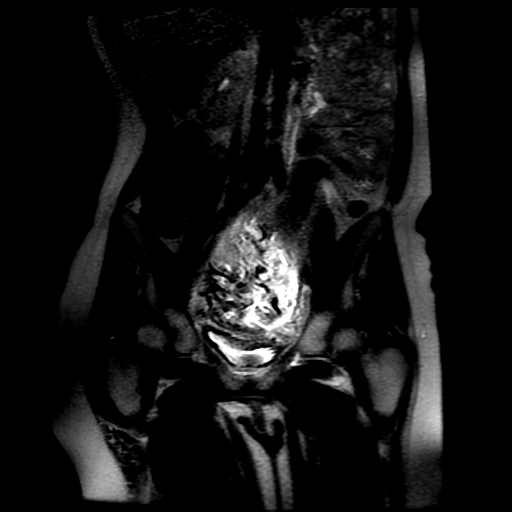
[im 39/39]
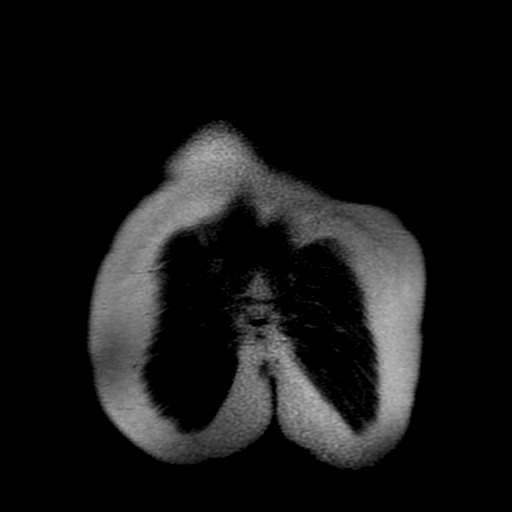

[Series 9: T2 fat-sat · coronal · 6.0mm · 0.86mm/px · 3 of 39 slices shown (2 of 2)]
[im 8/39]
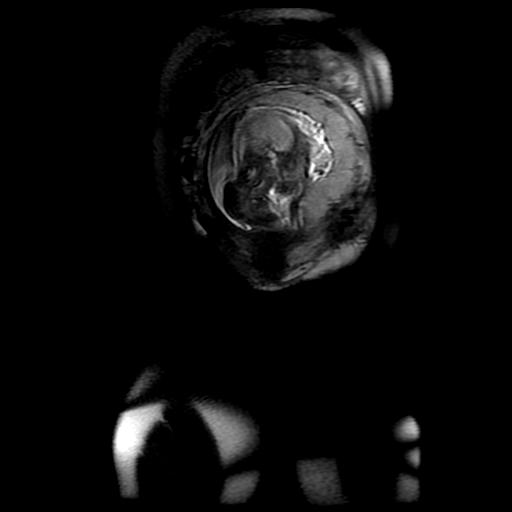
[im 23/39]
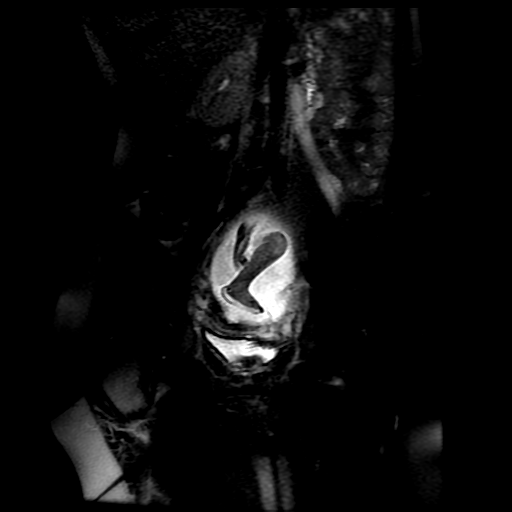
[im 39/39]
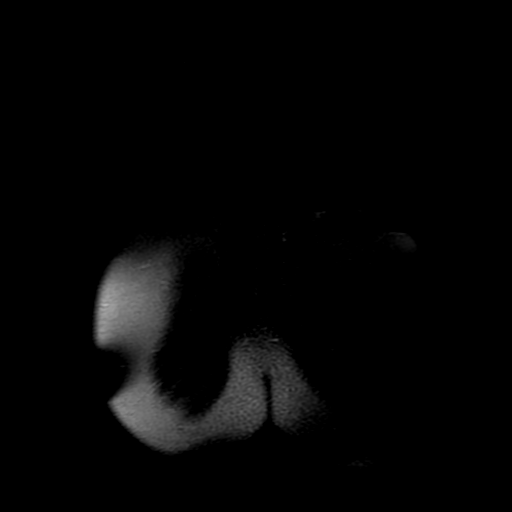

[19 of 48 positions shown; findings below may reference images not displayed]

FINDINGS: Urinary Tract: The bladder is decompressed. The kidneys are
partially visualized on the coronal images. There is no
hydronephrosis.

Bowel: Peripherally displaced by the gravid uterus. No bowel
distention or surrounding inflammation identified. The appendix is
not well visualized, although there is no evidence of pericecal
inflammation.

Vascular/Lymphatic: No significant vascular findings or enlarged
lymph nodes are seen.

Reproductive: Gravid uterus noted. The pregnancy is not specifically
evaluated by this examination. Small uterine fibroids are present
bilaterally, similar to previous ultrasound. No evidence of adnexal
mass.

Other: No ascites. No marker was placed over the patient's palpable
concern. There is no evidence of inguinal hernia, adenopathy or
mass.

Musculoskeletal: No acute or suspicious osseous findings. There is a
small synovial cyst posterior to the left L3-4 facet joint.
IMPRESSION: 1. No acute findings or explanation for the patient's palpable
concern. No evidence of inguinal hernia or adenopathy. Clinical
follow up of any palpable concern recommended.
2. Known uterine fibroids.

## 2017-09-26 NOTE — Patient Instructions (Signed)
Colposcopy, Care After  This sheet gives you information about how to care for yourself after your procedure. Your doctor may also give you more specific instructions. If you have problems or questions, contact your doctor.  What can I expect after the procedure?  If you did not have a tissue sample removed (did not have a biopsy), you may only have some spotting for a few days. You can go back to your normal activities.  If you had a tissue sample removed, it is common to have:  · Soreness and pain. This may last for a few days.  · Light-headedness.  · Mild bleeding from your vagina or dark-colored, grainy discharge from your vagina. This may last for a few days. You may need to wear a sanitary pad.  · Spotting for at least 48 hours after the procedure.    Follow these instructions at home:  · Take over-the-counter and prescription medicines only as told by your doctor. Ask your doctor what medicines you can start taking again. This is very important if you take blood-thinning medicine.  · Do not drive or use heavy machinery while taking prescription pain medicine.  · For 3 days, or as long as your doctor tells you, avoid:  ? Douching.  ? Using tampons.  ? Having sex.  · If you use birth control (contraception), keep using it.  · Limit activity for the first day after the procedure. Ask your doctor what activities are safe for you.  · It is up to you to get the results of your procedure. Ask your doctor when your results will be ready.  · Keep all follow-up visits as told by your doctor. This is important.  Contact a doctor if:  · You get a skin rash.  Get help right away if:  · You are bleeding a lot from your vagina. It is a lot of bleeding if you are using more than one pad an hour for 2 hours in a row.  · You have clumps of blood (blood clots) coming from your vagina.  · You have a fever.  · You have chills  · You have pain in your lower belly (pelvic area).  · You have signs of infection, such as vaginal  discharge that is:  ? Different than usual.  ? Yellow.  ? Bad-smelling.  · You have very pain or cramps in your lower belly that do not get better with medicine.  · You feel light-headed.  · You feel dizzy.  · You pass out (faint).  Summary  · If you did not have a tissue sample removed (did not have a biopsy), you may only have some spotting for a few days. You can go back to your normal activities.  · If you had a tissue sample removed, it is common to have mild pain and spotting for 48 hours.  · For 3 days, or as long as your doctor tells you, avoid douching, using tampons and having sex.  · Get help right away if you have bleeding, very bad pain, or signs of infection.  This information is not intended to replace advice given to you by your health care provider. Make sure you discuss any questions you have with your health care provider.  Document Released: 02/02/2008 Document Revised: 05/05/2016 Document Reviewed: 05/05/2016  Elsevier Interactive Patient Education © 2018 [employer]

## 2017-09-26 NOTE — Progress Notes (Signed)
Patient given informed consent, signed copy in the chart, time out was performed.  Placed in lithotomy position. Cervix viewed with speculum and colposcope after application of acetic acid.  12/16/2016 ASCUS- cannot exclude high grade lesion h/o prev LEEP 2011 and 2016  Colposcopy adequate?  yes Acetowhite lesions? no Punctation? no Mosaicism?  no Abnormal vasculature?  no Biopsies? no ECC? yes   Patient was given post procedure instructions.  She will be given results via Mychart.   Anahis Furgeson L. Harraway-Smith, M.D., Cherlynn June

## 2017-09-26 NOTE — Addendum Note (Signed)
Addended by: Phill Myron on: 09/26/2017 02:49 PM   Modules accepted: Orders

## 2017-09-29 ENCOUNTER — Telehealth: Payer: Self-pay

## 2017-09-29 LAB — CYTOLOGY - PAP
DIAGNOSIS: NEGATIVE
HPV (WINDOPATH): DETECTED — AB

## 2017-09-29 MED ORDER — FLUCONAZOLE 150 MG PO TABS: 150.0000 mg | ORAL_TABLET | Freq: Once | ORAL | 0 refills | Status: AC

## 2017-09-29 NOTE — Telephone Encounter (Signed)
Left message for patient that I was calling to return her phone call.  Kathrene Alu RN

## 2017-09-29 NOTE — Telephone Encounter (Signed)
Patient states that she is having vaginal itching and some discharge. Patient did have colposcopy 3 days ago and has noted the clear discharge but not having lots of itching. Kathrene Alu RN

## 2017-10-24 ENCOUNTER — Emergency Department (HOSPITAL_COMMUNITY): Payer: No Typology Code available for payment source

## 2017-10-24 ENCOUNTER — Encounter (HOSPITAL_COMMUNITY): Payer: Self-pay | Admitting: Emergency Medicine

## 2017-10-24 ENCOUNTER — Other Ambulatory Visit: Payer: Self-pay

## 2017-10-24 ENCOUNTER — Emergency Department (HOSPITAL_COMMUNITY)
Admission: EM | Admit: 2017-10-24 | Discharge: 2017-10-24 | Disposition: A | Discharge status: Home / Self Care | Payer: No Typology Code available for payment source | Attending: Emergency Medicine | Admitting: Emergency Medicine

## 2017-10-24 DIAGNOSIS — Z87891 Personal history of nicotine dependence: Secondary | ICD-10-CM | POA: Diagnosis not present

## 2017-10-24 DIAGNOSIS — R4182 Altered mental status, unspecified: Secondary | ICD-10-CM | POA: Diagnosis not present

## 2017-10-24 DIAGNOSIS — M25552 Pain in left hip: Secondary | ICD-10-CM | POA: Insufficient documentation

## 2017-10-24 DIAGNOSIS — I629 Nontraumatic intracranial hemorrhage, unspecified: Secondary | ICD-10-CM

## 2017-10-24 DIAGNOSIS — R112 Nausea with vomiting, unspecified: Secondary | ICD-10-CM | POA: Insufficient documentation

## 2017-10-24 DIAGNOSIS — R0789 Other chest pain: Secondary | ICD-10-CM | POA: Insufficient documentation

## 2017-10-24 DIAGNOSIS — M542 Cervicalgia: Secondary | ICD-10-CM | POA: Diagnosis present

## 2017-10-24 LAB — I-STAT CHEM 8, ED
BUN: 8 mg/dL (ref 6–20)
CALCIUM ION: 1.16 mmol/L (ref 1.15–1.40)
Chloride: 102 mmol/L (ref 101–111)
Creatinine, Ser: 0.7 mg/dL (ref 0.44–1.00)
GLUCOSE: 84 mg/dL (ref 65–99)
HCT: 42 % (ref 36.0–46.0)
HEMOGLOBIN: 14.3 g/dL (ref 12.0–15.0)
POTASSIUM: 3.7 mmol/L (ref 3.5–5.1)
SODIUM: 140 mmol/L (ref 135–145)
TCO2: 26 mmol/L (ref 22–32)

## 2017-10-24 LAB — CBC
HEMATOCRIT: 40.9 % (ref 36.0–46.0)
Hemoglobin: 13.5 g/dL (ref 12.0–15.0)
MCH: 26.7 pg (ref 26.0–34.0)
MCHC: 33 g/dL (ref 30.0–36.0)
MCV: 80.8 fL (ref 78.0–100.0)
PLATELETS: 338 10*3/uL (ref 150–400)
RBC: 5.06 MIL/uL (ref 3.87–5.11)
RDW: 14 % (ref 11.5–15.5)
WBC: 8.1 10*3/uL (ref 4.0–10.5)

## 2017-10-24 LAB — I-STAT BETA HCG BLOOD, ED (MC, WL, AP ONLY): I-stat hCG, quantitative: <5 m[IU]/mL (ref <5)

## 2017-10-24 MED ORDER — HYDROCODONE-ACETAMINOPHEN 5-325 MG PO TABS: 2.0000 | ORAL_TABLET | Freq: Four times a day (QID) | ORAL | 0 refills | Status: AC | PRN

## 2017-10-24 MED ORDER — METOCLOPRAMIDE HCL 5 MG/ML IJ SOLN
10.0000 mg | Freq: Once | INTRAMUSCULAR | Status: AC
  Administered 2017-10-24: 10 mg via INTRAVENOUS
  Filled 2017-10-24: qty 2

## 2017-10-24 MED ORDER — SODIUM CHLORIDE 0.9 % IV BOLUS (SEPSIS)
1000.0000 mL | Freq: Once | INTRAVENOUS | Status: AC
  Administered 2017-10-24: 1000 mL via INTRAVENOUS

## 2017-10-24 MED ORDER — HYDROCODONE-ACETAMINOPHEN 5-325 MG PO TABS
1.0000 | ORAL_TABLET | Freq: Once | ORAL | Status: AC
  Administered 2017-10-24: 1 via ORAL
  Filled 2017-10-24: qty 1

## 2017-10-24 MED ORDER — HYDROCODONE-ACETAMINOPHEN 5-325 MG PO TABS: 1.0000 | ORAL_TABLET | Freq: Once | ORAL | Status: DC

## 2017-10-24 MED ORDER — SODIUM CHLORIDE 0.9 % IV SOLN
INTRAVENOUS | Status: DC
  Administered 2017-10-24: 14:00:00 via INTRAVENOUS

## 2017-10-24 MED ORDER — IOPAMIDOL (ISOVUE-300) INJECTION 61%
INTRAVENOUS | Status: AC
  Administered 2017-10-24: 100 mL via INTRAVENOUS
  Filled 2017-10-24: qty 100

## 2017-10-24 MED ORDER — ACETAMINOPHEN 500 MG PO TABS
1000.0000 mg | ORAL_TABLET | Freq: Once | ORAL | Status: AC
  Administered 2017-10-24: 1000 mg via ORAL
  Filled 2017-10-24: qty 2

## 2017-10-24 MED ORDER — MORPHINE SULFATE (PF) 4 MG/ML IV SOLN: 4.0000 mg | Freq: Once | INTRAVENOUS | Status: DC
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
## 2017-10-24 NOTE — Discharge Instructions (Signed)
Alternate 600 mg of ibuprofen and [phone] mg of Tylenol every 3 hours as needed for pain. Do not exceed 4000 mg of Tylenol daily.  You may take Norco for severe pain but be aware that this contains Tylenol as well.  Do not drive, drink alcohol, or operate heavy machinery while taking this medicine.  Ice to areas of soreness for the next few days and then may move to heat. Do some gentle stretching throughout the day, especially during hot showers or baths. Take short frequent walks and avoid prolonged periods of sitting or laying. Expect to be sore for the next few day and follow up with primary care physician for recheck of ongoing symptoms but return to ER for emergent changing or worsening of symptoms such as severe headache that gets worse, altered mental status/behaving unusually, persistent vomiting, excessive drowsiness, numbness to the arms or legs, unsteady gait, or slurred speech.
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
## 2017-10-24 NOTE — ED Notes (Signed)
PA at bedside at this time.  

## 2017-10-24 NOTE — ED Notes (Signed)
Patient transported to CT 

## 2017-11-01 ENCOUNTER — Ambulatory Visit (INDEPENDENT_AMBULATORY_CARE_PROVIDER_SITE_OTHER): Payer: Self-pay | Admitting: Family Medicine

## 2017-11-01 ENCOUNTER — Ambulatory Visit (HOSPITAL_COMMUNITY)
Admission: RE | Admit: 2017-11-01 | Discharge: 2017-11-01 | Disposition: A | Discharge status: Home / Self Care | Payer: No Typology Code available for payment source | Source: Ambulatory Visit | Attending: Family Medicine | Admitting: Family Medicine

## 2017-11-01 ENCOUNTER — Encounter: Payer: Self-pay | Admitting: Family Medicine

## 2017-11-01 VITALS — BP 132/90 | HR 96 | Temp 98.2°F | Resp 16 | Ht 64.0 in | Wt 169.2 lb

## 2017-11-01 DIAGNOSIS — Z87820 Personal history of traumatic brain injury: Secondary | ICD-10-CM | POA: Diagnosis not present

## 2017-11-01 DIAGNOSIS — Z09 Encounter for follow-up examination after completed treatment for conditions other than malignant neoplasm: Secondary | ICD-10-CM | POA: Insufficient documentation

## 2017-11-01 DIAGNOSIS — I629 Nontraumatic intracranial hemorrhage, unspecified: Secondary | ICD-10-CM

## 2017-11-01 DIAGNOSIS — Z131 Encounter for screening for diabetes mellitus: Secondary | ICD-10-CM

## 2017-11-01 LAB — POCT GLYCOSYLATED HEMOGLOBIN (HGB A1C): HEMOGLOBIN A1C: 5.4

## 2017-11-01 MED ORDER — NAPROXEN 500 MG PO TABS: 500.0000 mg | ORAL_TABLET | Freq: Two times a day (BID) | ORAL | 0 refills | Status: DC

## 2017-11-01 NOTE — Patient Instructions (Signed)
Head Injury, Adult  There are many types of head injuries. They can be as minor as a bump. Some head injuries can be worse. Worse injuries include:  · A strong hit to the head that hurts the brain (concussion).  · A bruise of the brain (contusion). This means there is bleeding in the brain that can cause swelling.  · A cracked skull (skull fracture).  · Bleeding in the brain that gathers, gets thick (makes a clot), and forms a bump (hematoma).    Most problems from a head injury come in the first 24 hours. However, you may still have side effects up to 7-10 days after your injury. It is important to watch your condition for any changes.  Follow these instructions at home:  Activity  · Rest as much as possible.  · Avoid activities that are hard or tiring.  · Make sure you get enough sleep.  · Limit activities that need a lot of thought or attention, such as:  ? Watching TV.  ? Playing memory games and puzzles.  ? Job-related work or homework.  ? Working on the computer, social media, and texting.  · Avoid activities that could cause another head injury until your doctor says it is okay. This includes playing sports.  · Ask your doctor when it is safe for you to go back to your normal activities, such as work or school. Ask your doctor for a step-by-step plan for slowly going back to your normal activities.  · Ask your doctor when you can drive, ride a bicycle, or use heavy machinery. Never do these activities if you are dizzy.  Lifestyle  · Do not drink alcohol until your doctor says it is okay.  · Avoid drug use.  · If it is harder than usual to remember things, write them down.  · If you are easily distracted, try to do one thing at a time.  · Talk with family members or close friends when making important decisions.  · Tell your friends, family, a trusted coworker, and work manager about your injury, symptoms, and limits (restrictions). Have them watch for any problems that are new or getting worse.  General  instructions  · Take over-the-counter and prescription medicines only as told by your doctor.  · Have someone stay with you for 24 hours after your head injury. This person should watch you for any changes in your symptoms and be ready to get help.  · Keep all follow-up visits as told by your doctor. This is important.  How is this prevented?  · Work on your balance and strength. This can help you avoid falls.  · Wear a seatbelt when you are in a moving vehicle.  · Wear a helmet when:  ? Riding a bicycle.  ? Skiing.  ? Doing any other sport or activity that has a risk of injury.  · Drink alcohol only in moderation.  · Make your home safer by:  ? Getting rid of clutter from the floors and stairs, like things that can make you trip.  ? Using grab bars in bathrooms and handrails by stairs.  ? Placing non-slip mats on floors and in bathtubs.  ? Putting more light in dim areas.  Get help right away if:  · You have:  ? A very bad (severe) headache that is not helped by medicine.  ? Trouble walking or weakness in your arms and legs.  ? Clear or bloody fluid coming from your nose   or ears.  ? Changes in your seeing (vision).  ? Jerky movements that you cannot control (seizure).  · You throw up (vomit).  · Your symptoms get worse.  · You lose balance.  · Your speech is slurred.  · You pass out.  · You are sleepier and have trouble staying awake.  · The black centers of your eyes (pupils) change in size.  These symptoms may be an emergency. Do not wait to see if the symptoms will go away. Get medical help right away. Call your local emergency services (911 in the [place] Do not drive yourself to the hospital.  This information is not intended to replace advice given to you by your health care provider. Make sure you discuss any questions you have with your health care provider.  Document Released: 07/29/2008 Document Revised: 12/10/2016 Document Reviewed: 02/24/2016  Elsevier Interactive Patient Education © 2018 Elsevier  Inc.
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
## 2017-11-16 ENCOUNTER — Encounter: Payer: Self-pay | Admitting: Family Medicine

## 2017-11-16 ENCOUNTER — Ambulatory Visit (INDEPENDENT_AMBULATORY_CARE_PROVIDER_SITE_OTHER): Payer: Self-pay | Admitting: Family Medicine

## 2017-11-16 VITALS — BP 140/78 | HR 100 | Temp 98.0°F | Ht 64.0 in | Wt 168.0 lb

## 2017-11-16 DIAGNOSIS — S0990XD Unspecified injury of head, subsequent encounter: Secondary | ICD-10-CM
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
## 2020-11-24 ENCOUNTER — Other Ambulatory Visit: Payer: Self-pay

## 2020-11-24 ENCOUNTER — Encounter: Payer: Self-pay | Admitting: Obstetrics & Gynecology

## 2020-11-24 ENCOUNTER — Other Ambulatory Visit (HOSPITAL_COMMUNITY)
Admission: RE | Admit: 2020-11-24 | Discharge: 2020-11-24 | Disposition: A | Discharge status: Home / Self Care | Payer: Medicaid Other | Source: Ambulatory Visit | Attending: Obstetrics & Gynecology | Admitting: Obstetrics & Gynecology

## 2020-11-24 ENCOUNTER — Ambulatory Visit (INDEPENDENT_AMBULATORY_CARE_PROVIDER_SITE_OTHER): Payer: Medicaid Other | Admitting: Obstetrics & Gynecology

## 2020-11-24 VITALS — BP 121/87 | HR 81 | Ht 64.0 in | Wt 169.0 lb

## 2020-11-24 DIAGNOSIS — Z01419 Encounter for gynecological examination (general) (routine) without abnormal findings: Secondary | ICD-10-CM | POA: Insufficient documentation

## 2020-11-24 DIAGNOSIS — Z3009 Encounter for other general counseling and advice on contraception: Secondary | ICD-10-CM | POA: Diagnosis not present

## 2020-11-24 NOTE — Patient Instructions (Signed)
Laparoscopic Tubal Ligation Laparoscopic tubal ligation is a procedure to close the fallopian tubes. This is done to prevent pregnancy. When the fallopian tubes are closed, the eggs that your ovaries release cannot enter the uterus, and sperm cannot reach the released eggs. You should not have this procedure if you want to get pregnant someday or if you are unsure about having more children. Tell a health care provider about:  Any allergies you have.  All medicines you are taking, including vitamins, herbs, eye drops, creams, and over-the-counter medicines.  Any problems you or family members have had with anesthetic medicines.  Any blood disorders you have.  Any surgeries you have had.  Any medical conditions you have.  Whether you are pregnant or may be pregnant.  Any past pregnancies. What are the risks? Generally, this is a safe procedure. However, problems may occur, including:  Infection.  Bleeding.  Injury to other organs in the abdomen.  Side effects from anesthetic medicines.  Failure of the procedure. This procedure can increase your risk of an ectopic pregnancy. This is a pregnancy in which a fertilized egg attaches to the outside of the uterus. What happens before the procedure? Staying hydrated Follow instructions from your health care provider about hydration, which may include:  Up to 2 hours before the procedure - you may continue to drink clear liquids, such as water, clear fruit juice, black coffee, and plain tea. Eating and drinking restrictions Follow instructions from your health care provider about eating and drinking, which may include:  8 hours before the procedure - stop eating heavy meals or foods, such as meat, fried foods, or fatty foods.  6 hours before the procedure - stop eating light meals or foods, such as toast or cereal.  6 hours before the procedure - stop drinking milk or drinks that contain milk.  2 hours before the procedure - stop  drinking clear liquids. Medicines Ask your health care provider about:  Changing or stopping your regular medicines. This is especially important if you are taking diabetes medicines or blood thinners.  Taking medicines such as aspirin and ibuprofen. These medicines can thin your blood. Do not take these medicines unless your health care provider tells you to take them.  Taking over-the-counter medicines, vitamins, herbs, and supplements. Surgery safety Ask your health care provider:  How your surgery site will be marked.  What steps will be taken to help prevent infection. These steps may include: ? Removing hair at the surgery site. ? Washing skin with a germ-killing soap. ? Taking antibiotic medicine. General instructions  Do not use any products that contain nicotine or tobacco for at least 4 weeks before the procedure. These products include cigarettes, chewing tobacco, and vaping devices, such as e-cigarettes. If you need help quitting, ask your health care provider.  Plan to have someone take you home from the hospital.  If you will be going home right after the procedure, plan to have a responsible adult care for you for the time you are told. This is important. What happens during the procedure?  An IV will be inserted into one of your veins.  You will be given one or more of the following: ? A medicine to help you relax (sedative). ? A medicine to numb the area (local anesthetic). ? A medicine to make you fall asleep (general anesthetic). ? A medicine that is injected into an area of your body to numb everything below the injection site (regional anesthetic).  Your bladder  may be emptied with a small tube (catheter).  If you have been given a general anesthetic, a tube will be put down your throat to help you breathe.  Two small incisions will be made in your lower abdomen and near your belly button.  Your abdomen will be inflated with a gas. This will let the  surgeon see better and will give the surgeon room to work.  A lighted tube with camera (laparoscope) will be inserted into your abdomen through one of the incisions. Small instruments will be inserted through the other incision.  The fallopian tubes will be tied off, burned (cauterized), or blocked with a clip, ring, or clamp. A small portion in the center of each fallopian tube may be removed.  The gas will be released from the abdomen.  The incisions will be closed with stitches (sutures).  A bandage (dressing) will be placed over the incisions. The procedure may vary among health care providers and hospitals.      What happens after the procedure?  Your blood pressure, heart rate, breathing rate, and blood oxygen level will be monitored until you leave the hospital.  You will be given medicine to help with pain, nausea, and vomiting as needed.  You may have vaginal discharge after the procedure. You may need to wear a sanitary napkin.  If you were given a sedative during the procedure, it can affect you for several hours. Do not drive or operate machinery until your health care provider says that it is safe. Summary  Laparoscopic tubal ligation is a procedure that is done to prevent pregnancy.  You should not have this procedure if you want to get pregnant someday or if you are unsure about having more children.  The procedure is done using a thin, lighted tube (laparoscope) with a camera attached that will be inserted into your abdomen through an incision.  After the procedure you will be given medicine to help with pain, nausea, and vomiting as needed.  Plan to have someone take you home from the hospital. This information is not intended to replace advice given to you by your health care provider. Make sure you discuss any questions you have with your health care provider. Document Revised: 05/02/2020 Document Reviewed: 05/02/2020 Elsevier Patient Education  2021 [place], also called tubectomy, is the surgical removal of one of the fallopian tubes. The fallopian tubes are where eggs travel from the ovaries to the uterus. Removing one fallopian tube does not prevent you from becoming pregnant. It also does not cause problems with your menstrual periods. You may need this procedure if you:  Have a fertilized egg that attaches to the fallopian tube (ectopic pregnancy), especially one that causes the tube to burst or tear (rupture).  Have an infected fallopian tube.  Have cancer of the fallopian tube or nearby organs.  Have had an ovary removed due to a cyst or tumor.  Have had your uterus removed.  Are at high risk for ovarian cancer. There are three different methods that can be used for a salpingectomy:  An open method that involves making one large incision in your abdomen.  A laparoscopic method that involves using a thin, lighted tube with a tiny camera on the end (laparoscope) to help perform the procedure. The laparoscope will allow your surgeon to make several small incisions in the abdomen instead of one large incision.  A robot-assisted method that involves using a computer to control surgical instruments that are  attached to robotic arms. Tell a health care provider about:  Any allergies you have.  All medicines you are taking, including vitamins, herbs, eye drops, creams, and over-the-counter medicines.  Any problems you or family members have had with anesthetic medicines.  Any blood disorders you have.  Any surgeries you have had.  Any medical conditions you have.  Whether you are pregnant or may be pregnant. What are the risks? Generally, this is a safe procedure. However, problems may occur, including:  Infection.  Bleeding.  Allergic reactions to medicines.  Blood clots in the legs or lungs.  Damage to other structures or organs. What happens before the procedure? Medicines  Ask your  health care provider about: ? Changing or stopping your regular medicines. This is especially important if you are taking diabetes medicines or blood thinners. ? Taking medicines such as aspirin and ibuprofen. These medicines can thin your blood. Do not take these medicines unless your health care provider tells you to take them. ? Taking over-the-counter medicines, vitamins, herbs, and supplements. Staying hydrated Follow instructions from your health care provider about hydration, which may include:  Up to 2 hours before the procedure - you may continue to drink clear liquids, such as water, clear fruit juice, black coffee, and plain tea. Eating and drinking restrictions Follow instructions from your health care provider about eating and drinking, which may include:  8 hours before the procedure - stop eating heavy meals or foods, such as meat, fried foods, or fatty foods.  6 hours before the procedure - stop eating light meals or foods, such as toast or cereal.  6 hours before the procedure - stop drinking milk or drinks that contain milk.  2 hours before the procedure - stop drinking clear liquids. General instructions  Do not use any products that contain nicotine or tobacco for at least 4 weeks before the procedure. These products include cigarettes, e-cigarettes, and chewing tobacco. If you need help quitting, ask your health care provider.  You may have an exam or tests, such as: ? An electrocardiogram (ECG). ? A blood or urine test.  Ask your health care provider what steps will be taken to help prevent infection. These may include: ? Removing hair at the surgery site. ? Washing skin with a germ-killing soap. ? Taking antibiotic medicine.  Plan to have someone take you home from the hospital or clinic.  If you will be going home right after the procedure, plan to have someone with you for 24 hours. What happens during the procedure?  An IV will be inserted into one of  your veins.  You will be given one or more of the following: ? A medicine to help you relax (sedative). ? A medicine to make you fall asleep (general anesthetic).  A thin tube (catheter) may be inserted through your urethra and into your bladder to drain urine during your procedure.  Depending on the type of procedure you are having, one incision or several small incisions will be made in your abdomen.  Your fallopian tube will be cut and removed from where it attaches to your uterus.  Your blood vessels will be clamped and tied to prevent excess bleeding.  The incisions in your abdomen will be closed with stitches (sutures), staples, or skin glue.  A bandage (dressing) may be placed over your incisions. The procedure may vary among health care providers and hospitals. What happens after the procedure?  Your blood pressure, heart rate, breathing rate, and blood  oxygen level will be monitored until you leave the hospital.  You may continue to receive fluids and medicines through an IV.  You may continue to have a catheter draining your urine.  You may have to wear compression stockings. These stockings help to prevent blood clots and reduce swelling in your legs.  You will be given pain medicine as needed.  Do not drive for 24 hours if you were given a sedative during your procedure.   Summary  Salpingectomy is a surgical procedure to remove one of the fallopian tubes.  The procedure may be done with an open incision, a laparoscope, or computer-controlled instruments.  Depending on the type of procedure you are having, one incision or several small incisions will be made in your abdomen.  Your blood pressure, heart rate, breathing rate, and blood oxygen level will be monitored until you leave the hospital.  Plan to have someone take you home from the hospital or clinic. This information is not intended to replace advice given to you by your health care provider. Make sure you  discuss any questions you have with your health care provider. Document Revised: 08/07/2018 Document Reviewed: 08/07/2018 Elsevier Patient Education  2021 [employer].

## 2020-11-24 NOTE — Progress Notes (Signed)
Subjective:     Courtney Farley is a 40 y.o. female here for a routine exam.  Current complaints: none. Pt reports that she is currently breast feeding 38 1/2 year old son. Trying to wean him. She is considering permanent contraception with tubal ligation.   Gynecologic History No LMP recorded (exact date). (Menstrual status: Irregular Periods). Contraception: abstinence Last Pap: 09/26/2017. Results were: neg PAP with +hrHPV Last mammogram: n/a  Obstetric History OB History  Gravida Para Term Preterm AB Living  3 3 3     3   SAB IAB Ectopic Multiple Live Births          1    # Outcome Date GA Lbr Len/2nd Weight Sex Delivery Anes PTL Lv  3 Term 07/21/17 [redacted]w[redacted]d 27:31 / 00:09 7 lb 4 oz (3.289 kg) M Vag-Spont EPI  LIV  2 Term 02/07/05          1 Term 07/06/03           The following portions of the patient's history were reviewed and updated as appropriate: allergies, current medications, past family history, past medical history, past social history, past surgical history and problem list.  Review of Systems Pertinent items are noted in HPI.    Objective:  BP 121/87   Pulse 81   Ht 5\' 4"  (1.626 m)   Wt 169 lb (76.7 kg)   LMP  (Exact Date)   BMI 29.01 kg/m  General Appearance:    Alert, cooperative, no distress, appears stated age  Head:    Normocephalic, without obvious abnormality, atraumatic  Eyes:    conjunctiva/corneas clear, EOM's intact, both eyes  Ears:    Normal external ear canals, both ears  Nose:   Nares normal, septum midline, mucosa normal, no drainage    or sinus tenderness  Throat:   Lips, mucosa, and tongue normal; teeth and gums normal  Neck:   Supple, symmetrical, trachea midline, no adenopathy;    thyroid:  no enlargement/tenderness/nodules  Back:     Symmetric, no curvature, ROM normal, no CVA tenderness  Lungs:     respirations unlabored  Chest Wall:    No tenderness or deformity   Heart:    Regular rate and rhythm  Breast Exam:    No tenderness, masses, or  nipple abnormality  Abdomen:     Soft, non-tender, bowel sounds active all four quadrants,    no masses, no organomegaly; umbilical piercing  Genitalia:    Normal female without lesion, discharge or tenderness   Clitoral piercing  Extremities:   Extremities normal, atraumatic, no cyanosis or edema  Pulses:   2+ and symmetric all extremities  Skin:   Skin color, texture, turgor normal, no rashes or lesions    Assessment:    Healthy female exam.   Contraception counseling. Pt wants to consider tubal ligation. I have discussed salpingectomy with pt.   Plan:  Evangaline was seen today for gynecologic exam.  Diagnoses and all orders for this visit:  Well female exam with routine gynecological exam -     Cytology - PAP( [place])  Encounter for counseling regarding contraception  Title XIX papers signed.  F/u in 1 year or sooner prn   Carolyn L. Harraway-Smith, M.D., Cherlynn June

## 2020-11-27 LAB — CYTOLOGY - PAP
Comment: NEGATIVE
Comment: NEGATIVE
Diagnosis: NEGATIVE
HPV 16: POSITIVE — AB
HPV 18 / 45: NEGATIVE
High risk HPV: POSITIVE — AB

## 2021-04-06 ENCOUNTER — Ambulatory Visit (INDEPENDENT_AMBULATORY_CARE_PROVIDER_SITE_OTHER): Payer: 59 | Admitting: Obstetrics & Gynecology

## 2021-04-06 ENCOUNTER — Encounter: Payer: Self-pay | Admitting: Obstetrics & Gynecology

## 2021-04-06 ENCOUNTER — Other Ambulatory Visit: Payer: Self-pay

## 2021-04-06 VITALS — BP 133/80 | HR 100 | Ht 64.0 in | Wt 170.0 lb

## 2021-04-06 DIAGNOSIS — A630 Anogenital (venereal) warts: Secondary | ICD-10-CM | POA: Diagnosis not present

## 2021-04-06 MED ORDER — IMIQUIMOD 5 % EX CREA: TOPICAL_CREAM | CUTANEOUS | 0 refills | Status: AC

## 2021-04-06 NOTE — Patient Instructions (Signed)
Imiquimod skin cream What is this medication? IMIQUIMOD (i mi KWI mod) cream is used to treat external genital or anal warts. It is also used to treat other skin conditions such as actinic keratosis andcertain types of skin cancer. This medicine may be used for other purposes; ask your health care provider orpharmacist if you have questions. COMMON BRAND NAME(S): Aldara, Zyclara What should I tell my care team before I take this medication? They need to know if you have any of these conditions: decreased immune function an unusual or allergic reaction to imiquimod, other medicines, foods, dyes, or preservatives pregnant or trying to get pregnant breast-feeding How should I use this medication? This medicine is for external use only. Do not take by mouth. Follow the directions on the prescription label. Apply just before bedtime. Wash your hands before and after use. Apply a thin layer of cream and massage gently into the affected areas until no longer visible. Do not use in the mouth, eyes or the vagina. Use this medicine only on the affected area as directed by your health care provider. Do not use for longer than prescribed. It is important not to use more medicine than prescribed. To do so may increase the chance ofside effects. Talk to your pediatrician regarding the use of this medicine in children. While this drug may be prescribed for children as young as 70 years of age forselected conditions, precautions do apply. Overdosage: If you think you have taken too much of this medicine contact apoison control center or emergency room at once. NOTE: This medicine is only for you. Do not share this medicine with others. What if I miss a dose? If you miss a dose, use it as soon as you can. If it is almost time for yournext dose, use only that dose. Do not use double or extra doses. What may interact with this medication? Interactions are not expected. Do not use any other medicines on the  treatedarea without asking your doctor or health care professional. This list may not describe all possible interactions. Give your health care provider a list of all the medicines, herbs, non-prescription drugs, or dietary supplements you use. Also tell them if you smoke, drink alcohol, or use illegaldrugs. Some items may interact with your medicine. What should I watch for while using this medication? Visit your health care professional for regular checks on your progress. Do not use this medicine until the skin has healed from any other drug(example: podofilox or podophyllin resin) or surgical skin treatment. Females should receive regular pelvic exams while being treated for genital warts. Most patients see improvement within 4 weeks. It may take up to 16 weeks to see a full clearing of the warts. This medicine is not a cure. New warts may develop during or after treatment. Avoid sexual (genital, anal, oral) contact while the cream is on the skin. If warts are visible in the genital area, sexual contact should be avoided until the warts are treated. The use of latex condoms during sexual contact may reduce, but not entirely prevent, infecting others. This medicine may weaken condoms, diaphragms, cervical caps or other barrier devices and make them less effective as birth control. Do not cover the treated area with an airtight bandage. Cotton gauze dressings can be used. Cotton underwear can be worn after using this medicine on the genital or analarea. Actinic keratoses that were not seen before may appear during treatment and may later go away. The treatment area and surrounding area may  lighten or darken after treatment with this medicine. These skin color changes may be permanentin some patients. If you experience a skin reaction at the treatment site that interferes or prevents you from doing any daily activity, contact your health care provider. You may need a rest period from treatment. Treatment may  be restarted once the reaction has gotten better as recommended by your doctor or health careprofessional. This medicine can make you more sensitive to the sun. Keep out of the sun. If you cannot avoid being in the sun, wear protective clothing and use sunscreen.Do not use sun lamps or tanning beds/booths. What side effects may I notice from receiving this medication? Side effects that you should report to your doctor or health care professionalas soon as possible: open sores with or without drainage skin infection skin rash unusual or severe skin reaction Side effects that usually do not require medical attention (report to yourdoctor or health care professional if they continue or are bothersome): burning or itching redness of the skin (very common but is usually not painful or harmful) scabbing, crusting, or peeling skin skin that becomes hard or thickened swelling of the skin This list may not describe all possible side effects. Call your doctor for medical advice about side effects. You may report side effects to FDA at1-800-FDA-1088. Where should I keep my medication? Keep out of the reach of children. Store between 4 and 25 degrees C (39 and 77 degrees F). Do not freeze. Throw away any unused medicine after the expiration date. Discard packet afterapplying to affected area. Partial packets should not be saved or reused. NOTE: This sheet is a summary. It may not cover all possible information. If you have questions about this medicine, talk to your doctor, pharmacist, orhealth care provider.  2022 [employer] Standard (2008-07-30 10:33:25)

## 2021-04-06 NOTE — Progress Notes (Signed)
Patient states she notice some growths on her vulva on July 7th. Denies and burning or itching. Kathrene Alu RN

## 2021-04-06 NOTE — Progress Notes (Signed)
History:  40 y.o. [id number] here today for f/u of vulvar lesions. 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 She denies other issues.   The following portions of the patient's history were reviewed and updated as appropriate: allergies, current medications, past family history, past medical history, past social history, past surgical history and problem list.  Review of Systems:  Pertinent items are noted in HPI.    Objective:  Physical Exam Blood pressure 133/80, pulse 100, height '5\' 4"'$  (1.626 m), weight 170 lb (77.1 kg), currently breastfeeding.  CONSTITUTIONAL: Well-developed, well-nourished female in no acute distress.  HENT:  Normocephalic, atraumatic EYES: Conjunctivae and EOM are normal. No scleral icterus.  NECK: Normal range of motion SKIN: Skin is warm and dry. No rash noted. Not diaphoretic.No pallor. [place]: Alert and oriented to person, place, and time. Normal coordination.  Pelvic: Normal appearing external genitalia with the exception of 3 small condyloma on the perineum   Diagnoses and all orders for this visit:  Condyloma acuminata -     imiquimod (ALDARA) 5 % cream; Apply topically 3 (three) times a week.   Dmonte Maher L. Harraway-Smith, M.D., Cherlynn June

## 2021-05-19 ENCOUNTER — Telehealth: Payer: Self-pay | Admitting: General Practice

## 2021-05-19 NOTE — Telephone Encounter (Signed)
Called patient to schedule 3 month follow up with Dr Ihor Dow in November.  Patient did not answer and was unable to leave a message on VM.  Mychart message sent.

## 2021-06-06 ENCOUNTER — Encounter: Payer: Self-pay | Admitting: Radiology
